# Patient Record
Sex: Female | Born: 1990 | Race: Black or African American | Hispanic: No | Marital: Single | Smoking: Never smoker
Health system: Southern US, Community
[De-identification: ages and names within clinical notes are randomized; demographics above are authoritative.]

## PROBLEM LIST (undated history)

## (undated) ENCOUNTER — Inpatient Hospital Stay (HOSPITAL_COMMUNITY): Payer: Self-pay

## (undated) DIAGNOSIS — F32A Depression, unspecified: Secondary | ICD-10-CM

## (undated) DIAGNOSIS — G510 Bell's palsy: Secondary | ICD-10-CM

## (undated) DIAGNOSIS — Z8619 Personal history of other infectious and parasitic diseases: Secondary | ICD-10-CM

## (undated) DIAGNOSIS — F329 Major depressive disorder, single episode, unspecified: Secondary | ICD-10-CM

## (undated) DIAGNOSIS — D649 Anemia, unspecified: Secondary | ICD-10-CM

## (undated) HISTORY — DX: Personal history of other infectious and parasitic diseases: Z86.19

## (undated) HISTORY — DX: Depression, unspecified: F32.A

## (undated) HISTORY — PX: NO PAST SURGERIES: SHX2092

## (undated) SURGERY — Surgical Case
Anesthesia: *Unknown

---
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
## 2012-10-28 DIAGNOSIS — G510 Bell's palsy: Secondary | ICD-10-CM

## 2012-10-28 HISTORY — DX: Bell's palsy: G51.0

## 2012-10-28 NOTE — L&D Delivery Note (Signed)
Delivery Summary for Suzanne Lamb  Labor Events:   Preterm labor:   Rupture date:   Rupture time:   Rupture type: Spontaneous  Fluid Color: Light Meconium  Induction:   Augmentation:   Complications:   Cervical ripening:          Delivery:   Episiotomy:   Lacerations:   Repair suture:   Repair # of packets:   Blood loss (ml):    Information for the patient's newborn:  [name], West [[id number]]    Delivery 06/16/2013 6:16 PM by  Vaginal, Spontaneous Delivery Sex:  female Gestational Age: <None> Delivery Clinician:  Thresia Ramanathan A Sache Sane Living?: Yes        APGARS  One minute Five minutes Ten minutes  Skin color: 0   1      Heart rate: 2   2      Grimace: 2   2      Muscle tone: 2   2      Breathing: 2   2      Totals: 8  9      Presentation/position: Vertex  Right Occiput Anterior Resuscitation: None  Cord information: 3 vessels   Disposition of cord blood: No    Blood gases sent? No Complications: None  Placenta: Delivered: 06/16/2013 6:21 PM  Spontaneous  Intact appearance Newborn Measurements: Weight:   Height:   Head circumference:   Chest circumference:   Other providers:  Bethann Berkshire  Additional  information: Forceps:   Vacuum:   Breech:   Observed anomalies No anomalies noted       Delivery Note At 6:16 PM a viable female was delivered via Vaginal, Spontaneous Delivery (Presentation: Right Occiput Anterior).  APGAR: 8, 9; weight 7 lb 2.1 oz (3235 g).   Placenta status: Intact, Spontaneous.  Cord: 3 vessels with the following complications: None.  Cord pH: not sent  Anesthesia: None  Episiotomy: None Lacerations: 2nd degree Suture Repair: 2.0 chromic Est. Blood Loss (mL): 400  Mom to postpartum.  Baby to nursery-stable.  [name] A 06/17/2013, 10:52 AM

## 2012-11-25 ENCOUNTER — Encounter (HOSPITAL_COMMUNITY): Payer: Self-pay

## 2012-11-25 ENCOUNTER — Emergency Department (HOSPITAL_COMMUNITY)
Admission: EM | Admit: 2012-11-25 | Discharge: 2012-11-25 | Disposition: A | Discharge status: Home / Self Care | Payer: Medicaid Other | Attending: Emergency Medicine | Admitting: Emergency Medicine

## 2012-11-25 DIAGNOSIS — N949 Unspecified condition associated with female genital organs and menstrual cycle: Secondary | ICD-10-CM

## 2012-11-25 DIAGNOSIS — N760 Acute vaginitis: Secondary | ICD-10-CM

## 2012-11-25 DIAGNOSIS — Z331 Pregnant state, incidental: Secondary | ICD-10-CM | POA: Insufficient documentation

## 2012-11-25 DIAGNOSIS — R110 Nausea: Secondary | ICD-10-CM | POA: Insufficient documentation

## 2012-11-25 DIAGNOSIS — Z3490 Encounter for supervision of normal pregnancy, unspecified, unspecified trimester: Secondary | ICD-10-CM

## 2012-11-25 LAB — URINALYSIS, MICROSCOPIC ONLY
Bilirubin Urine: NEGATIVE
Glucose, UA: 100 mg/dL — AB
Hgb urine dipstick: NEGATIVE
Ketones, ur: NEGATIVE mg/dL
Leukocytes, UA: NEGATIVE
Nitrite: NEGATIVE
Protein, ur: NEGATIVE mg/dL
Specific Gravity, Urine: 1.024 (ref 1.005–1.030)
Urobilinogen, UA: 1 mg/dL (ref 0.0–1.0)
pH: 5.5 (ref 5.0–8.0)

## 2012-11-25 LAB — WET PREP, GENITAL
Trich, Wet Prep: NONE SEEN
Yeast Wet Prep HPF POC: NONE SEEN

## 2012-11-25 LAB — GC/CHLAMYDIA PROBE AMP
CT Probe RNA: NEGATIVE
GC Probe RNA: NEGATIVE

## 2012-11-25 MED ORDER — METRONIDAZOLE 0.75 % VA GEL: 1.0000 | Freq: Every day | VAGINAL | Status: DC

## 2012-11-25 MED ORDER — PRENATAL COMPLETE 14-0.4 MG PO TABS: 1.0000 | ORAL_TABLET | Freq: Every day | ORAL | Status: DC

## 2012-11-25 MED ORDER — METRONIDAZOLE 0.75 % VA GEL
1.0000 | Freq: Once | VAGINAL | Status: AC
  Administered 2012-11-25: 1 via VAGINAL
  Filled 2012-11-25: qty 70

## 2012-11-25 NOTE — ED Notes (Addendum)
Patient presents with lower abd pain x "several weeks". Described as an intermittent "twisting" and "throbbing" pain. Denies N/V, constipation or diarrhea. No fevers or chills. LMP 1st week of Dec 2013. sts that she "probably" could be pregnant.

## 2012-11-25 NOTE — ED Provider Notes (Signed)
History     CSN: [id number]  Arrival date & time 11/25/12  0030   First MD Initiated Contact with Patient 11/25/12 0032      Chief Complaint  Patient presents with  . Abdominal Pain    (Consider location/radiation/quality/duration/timing/severity/associated sxs/prior treatment) HPI 22 year old female presents to emergency room complaining of lower abdomen/pelvic pain over the last month. Patient is unsure of her last mental period, thinks it was end of November or beginning of December. She reports her periods are usually very irregular. She is sexually active, reports she uses condoms every time. She has had some nausea. She denies any vomiting, no fever no diarrhea. She reports she has normal bowel movements. Pain is constant, stretches from her left pelvis up into her left ribs. She has not taken anything for the pain. She has not had a home pregnancy test. No prior pregnancies. She denies any urinary symptoms, no vaginal discharge. History reviewed. No pertinent past medical history.  History reviewed. No pertinent past surgical history.  No family history on file.  History  Substance Use Topics  . Smoking status: Never Smoker   . Smokeless tobacco: Never Used  . Alcohol Use: No    OB History    Grav Para Term Preterm Abortions TAB SAB Ect Mult Living                  Review of Systems  See History of Present Illness; otherwise all other systems are reviewed and negative Allergies  Review of patient's allergies indicates no known allergies.  Home Medications  No current outpatient prescriptions on file.  BP 114/79  Pulse 92  Temp 98.7 F (37.1 C) (Oral)  Resp 16  SpO2 100%  LMP 09/27/2012  Physical Exam  Nursing note and vitals reviewed. Constitutional: She is oriented to person, place, and time. She appears well-developed and well-nourished.  HENT:  Head: Normocephalic and atraumatic.  Nose: Nose normal.  Mouth/Throat: Oropharynx is clear and moist.    Eyes: Conjunctivae normal and EOM are normal. Pupils are equal, round, and reactive to light.  Neck: Normal range of motion. Neck supple. No JVD present. No tracheal deviation present. No thyromegaly present.  Cardiovascular: Normal rate, regular rhythm, normal heart sounds and intact distal pulses.  Exam reveals no gallop and no friction rub.   No murmur heard. Pulmonary/Chest: Effort normal and breath sounds normal. No stridor. No respiratory distress. She has no wheezes. She has no rales. She exhibits no tenderness.  Abdominal: Soft. Bowel sounds are normal. She exhibits mass (gravid uterus felt just out of the pelvis halfway in between the umbilicus and pubic bone). She exhibits no distension. There is tenderness (mild lower abdominal tenderness left greater than right). There is no rebound and no guarding.  Genitourinary: Vagina normal.       Gravid uterus palpated, uterus most likely retroverted. No adnexal tenderness. No vaginal discharge noted.  Musculoskeletal: Normal range of motion. She exhibits no edema and no tenderness.  Lymphadenopathy:    She has no cervical adenopathy.  Neurological: She is alert and oriented to person, place, and time. No cranial nerve deficit. She exhibits normal muscle tone. Coordination normal.  Skin: Skin is warm and dry. No rash noted. No erythema. No pallor.  Psychiatric: She has a normal mood and affect. Her behavior is normal. Judgment and thought content normal.    ED Course  Procedures (including critical care time)  Labs Reviewed  URINALYSIS, MICROSCOPIC ONLY - Abnormal; Notable for the following:  APPearance CLOUDY (*)     Glucose, UA 100 (*)     Squamous Epithelial / LPF FEW (*)     All other components within normal limits  POCT PREGNANCY, URINE - Abnormal; Notable for the following:    Preg Test, Ur POSITIVE (*)     All other components within normal limits  WET PREP, GENITAL - Abnormal; Notable for the following:    Clue Cells Wet  Prep HPF POC MODERATE (*)     WBC, Wet Prep HPF POC FEW (*)     All other components within normal limits  GC/CHLAMYDIA PROBE AMP   No results found.   1. Intrauterine pregnancy   2. Round ligament pain   3. Bacterial vaginosis       MDM  22 year old female with positive pregnancy test. Patient is unsure of LMP. If we oh by December 1 as LMP, gestational age is 8 weeks and 3 days. Bedside ultrasound completed. IUP, with Active fetus noted with heart rates in 170s, suspect patient is further along than her LMP estimate. Suspect anywhere from 12-15 weeks. Will start patient on prenatal vitamins. Pain in left lower quadrant most likely round ligament pain, will have patient take Tylenol as needed. We'll refer her to local OB groups.        Olivia Mackie, MD 11/25/12 [phone]

## 2012-11-25 NOTE — ED Notes (Signed)
Pt discharged.Vital signs stable and GCS 15 

## 2013-02-02 LAB — OB RESULTS CONSOLE HEPATITIS B SURFACE ANTIGEN: Hepatitis B Surface Ag: NEGATIVE

## 2013-02-02 LAB — OB RESULTS CONSOLE HIV ANTIBODY (ROUTINE TESTING): HIV: NONREACTIVE

## 2013-02-02 LAB — OB RESULTS CONSOLE GC/CHLAMYDIA: Chlamydia: NEGATIVE

## 2013-02-02 LAB — OB RESULTS CONSOLE RPR: RPR: NONREACTIVE

## 2013-02-02 LAB — OB RESULTS CONSOLE ABO/RH: RH Type: POSITIVE

## 2013-03-11 ENCOUNTER — Other Ambulatory Visit: Payer: Self-pay

## 2013-03-25 ENCOUNTER — Ambulatory Visit (HOSPITAL_COMMUNITY)
Admission: RE | Admit: 2013-03-25 | Discharge: 2013-03-25 | Disposition: A | Discharge status: Home / Self Care | Payer: Medicaid Other | Source: Ambulatory Visit | Attending: Obstetrics and Gynecology | Admitting: Obstetrics and Gynecology

## 2013-03-25 ENCOUNTER — Other Ambulatory Visit: Payer: Self-pay | Admitting: Obstetrics and Gynecology

## 2013-03-25 ENCOUNTER — Encounter (HOSPITAL_COMMUNITY): Payer: Self-pay

## 2013-03-25 DIAGNOSIS — O26872 Cervical shortening, second trimester: Secondary | ICD-10-CM

## 2013-03-25 DIAGNOSIS — Z3689 Encounter for other specified antenatal screening: Secondary | ICD-10-CM | POA: Insufficient documentation

## 2013-03-25 DIAGNOSIS — O26873 Cervical shortening, third trimester: Secondary | ICD-10-CM

## 2013-03-25 DIAGNOSIS — O26879 Cervical shortening, unspecified trimester: Secondary | ICD-10-CM | POA: Insufficient documentation

## 2013-03-25 MED ORDER — BETAMETHASONE SOD PHOS & ACET 6 (3-3) MG/ML IJ SUSP
12.0000 mg | Freq: Once | INTRAMUSCULAR | Status: AC
  Administered 2013-03-25: 12 mg via INTRAMUSCULAR
  Filled 2013-03-25: qty 2

## 2013-03-25 NOTE — ED Notes (Signed)
Pt received first dose of BMZ IM in right buttocks.  2nd dose to be given in MD office tomorrow per Dr. Stefano Gaul.

## 2013-03-25 NOTE — Progress Notes (Signed)
Maternal Fetal Medicine Consultation  Requesting Provider(s): Osborn Coho, MD  Reason for consultation: shortened cervix  HPI: Suzanne Lamb is a 22 yo G1P0 currently at 43 6/7 weeks who was seen for consultation due to recent diagnosis of shortened cervix on recent clinic ultrasound.  On ultrasound on 5/16, she was noted to have a cervical length of 1.4-1.5 cm with funneling.  She is seen today for evaluation and recommendations for management.  She denies uterine contractions, vaginal bleeding or leakage of fluid.  Her prenatal course has otherwise been uncomplicated.  OB History: OB History   Grav Para Term Preterm Abortions TAB SAB Ect Mult Living   1               PMH: neg  PSH: neg  Meds: PNV  Allergies: NKDA  FH: Denies family history of birth defects or hereditary disorders  Soc: Denies tobacco, ETOH or illicit drug use  Review of Systems: no vaginal bleeding or cramping/contractions, no LOF, no nausea/vomiting. All other systems reviewed and are negative.  PE:  180 lbs, 113/68, 110  GEN: well-appearing female ABD: gravid, NT  Ultrasound: Single IUP at 28 6/7 weeks Limited ultrasound for cervical length only  TVUS - cervical length of 1.1 cm noted.  Dynamic changes associated with U-shaped funneling in response to abdominal pressure  A/P: 1) Single IUP at 28 6/7 weeks         2) Shortened cervical length (1.1 cm) - the findings and limitations of the study were discussed.  She is currently asymptomatic, but clearly at increased risk for preterm delivery.  The benefit of vaginal progesterone at this gestational age is limited, but would recommend a course of betamethasone.  Her first dose was given in the clinic today, and she will follow up with [facility] tomorrow for her second dose.  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  A letter was given to restrict the amount of time that she spends on her feet.  Recommend follow up cervical length in 1  week - if cervical shortening is noted to be worsening, recommend modified bedrest at that time.  Preterm labor precautions were given.   Thank you for the opportunity to be a part of the care of Suzanne Lamb. Please contact our office if we can be of further assistance.   I spent approximately 20 minutes with this patient with over 50% of time spent in face-to-face counseling.  Alpha Gula, MD Maternal Fetal Medicine

## 2013-03-26 ENCOUNTER — Ambulatory Visit (HOSPITAL_COMMUNITY)
Admission: RE | Admit: 2013-03-26 | Discharge: 2013-03-26 | Disposition: A | Discharge status: Home / Self Care | Payer: Medicaid Other | Source: Ambulatory Visit | Attending: Obstetrics and Gynecology | Admitting: Obstetrics and Gynecology

## 2013-03-26 DIAGNOSIS — O4700 False labor before 37 completed weeks of gestation, unspecified trimester: Secondary | ICD-10-CM | POA: Insufficient documentation

## 2013-03-26 DIAGNOSIS — Z3689 Encounter for other specified antenatal screening: Secondary | ICD-10-CM | POA: Insufficient documentation

## 2013-03-26 DIAGNOSIS — O26879 Cervical shortening, unspecified trimester: Secondary | ICD-10-CM | POA: Insufficient documentation

## 2013-03-26 MED ORDER — BETAMETHASONE SOD PHOS & ACET 6 (3-3) MG/ML IJ SUSP
12.0000 mg | Freq: Once | INTRAMUSCULAR | Status: AC
  Administered 2013-03-26: 12 mg via INTRAMUSCULAR
  Filled 2013-03-26: qty 2

## 2013-03-26 NOTE — ED Notes (Signed)
Pt back today stating that Dr. Debria Garret office was unable to administer 2nd dose of celestone.  Medication ordered and will administer.

## 2013-04-01 ENCOUNTER — Ambulatory Visit (HOSPITAL_COMMUNITY)
Admission: RE | Admit: 2013-04-01 | Discharge: 2013-04-01 | Disposition: A | Discharge status: Home / Self Care | Payer: Medicaid Other | Source: Ambulatory Visit | Attending: Obstetrics and Gynecology | Admitting: Obstetrics and Gynecology

## 2013-04-01 DIAGNOSIS — O26873 Cervical shortening, third trimester: Secondary | ICD-10-CM

## 2013-04-01 DIAGNOSIS — O26879 Cervical shortening, unspecified trimester: Secondary | ICD-10-CM | POA: Insufficient documentation

## 2013-04-01 DIAGNOSIS — Z3689 Encounter for other specified antenatal screening: Secondary | ICD-10-CM | POA: Insufficient documentation

## 2013-05-20 LAB — OB RESULTS CONSOLE GC/CHLAMYDIA: Chlamydia: NEGATIVE

## 2013-06-15 ENCOUNTER — Encounter (HOSPITAL_COMMUNITY): Payer: Self-pay | Admitting: *Deleted

## 2013-06-15 ENCOUNTER — Telehealth (HOSPITAL_COMMUNITY): Payer: Self-pay | Admitting: *Deleted

## 2013-06-15 NOTE — Telephone Encounter (Signed)
Preadmission screen  

## 2013-06-16 ENCOUNTER — Inpatient Hospital Stay (HOSPITAL_COMMUNITY)
Admission: AD | Admit: 2013-06-16 | Discharge: 2013-06-18 | DRG: 775 | Disposition: A | Discharge status: Home / Self Care | Payer: Medicaid Other | Source: Ambulatory Visit | Attending: Obstetrics and Gynecology | Admitting: Obstetrics and Gynecology

## 2013-06-16 ENCOUNTER — Encounter (HOSPITAL_COMMUNITY): Payer: Self-pay | Admitting: *Deleted

## 2013-06-16 DIAGNOSIS — D62 Acute posthemorrhagic anemia: Secondary | ICD-10-CM | POA: Diagnosis not present

## 2013-06-16 DIAGNOSIS — D649 Anemia, unspecified: Secondary | ICD-10-CM | POA: Diagnosis not present

## 2013-06-16 DIAGNOSIS — O9903 Anemia complicating the puerperium: Secondary | ICD-10-CM | POA: Diagnosis not present

## 2013-06-16 LAB — AMNIOSTAT, AMNIOTIC FLUID: Gestational Age: 40.5 weeks

## 2013-06-16 LAB — CBC
HCT: 38.5 % (ref 36.0–46.0)
Hemoglobin: 12 g/dL (ref 12.0–15.0)
MCH: 31.8 pg (ref 26.0–34.0)
MCHC: 31.2 g/dL (ref 30.0–36.0)
MCV: 102.1 fL — ABNORMAL HIGH (ref 78.0–100.0)
Platelets: 221 K/uL (ref 150–400)
RBC: 3.77 MIL/uL — ABNORMAL LOW (ref 3.87–5.11)
RDW: 15.9 % — ABNORMAL HIGH (ref 11.5–15.5)
WBC: 20.8 K/uL — ABNORMAL HIGH (ref 4.0–10.5)

## 2013-06-16 MED ORDER — IBUPROFEN 600 MG PO TABS: 600.0000 mg | ORAL_TABLET | Freq: Four times a day (QID) | ORAL | Status: DC | PRN

## 2013-06-16 MED ORDER — TETANUS-DIPHTH-ACELL PERTUSSIS 5-2.5-18.5 LF-MCG/0.5 IM SUSP: 0.5000 mL | Freq: Once | INTRAMUSCULAR | Status: DC

## 2013-06-16 MED ORDER — WITCH HAZEL-GLYCERIN EX PADS: 1.0000 "application " | MEDICATED_PAD | CUTANEOUS | Status: DC | PRN

## 2013-06-16 MED ORDER — IBUPROFEN 600 MG PO TABS
600.0000 mg | ORAL_TABLET | Freq: Four times a day (QID) | ORAL | Status: DC
  Administered 2013-06-16 – 2013-06-18 (×5): 600 mg via ORAL
  Filled 2013-06-16 (×6): qty 1

## 2013-06-16 MED ORDER — PRENATAL MULTIVITAMIN CH
1.0000 | ORAL_TABLET | Freq: Every day | ORAL | Status: DC
  Administered 2013-06-17: 1 via ORAL
  Filled 2013-06-16: qty 1

## 2013-06-16 MED ORDER — LIDOCAINE HCL (PF) 1 % IJ SOLN
INTRAMUSCULAR | Status: AC
  Administered 2013-06-16: 30 mL
  Filled 2013-06-16: qty 30

## 2013-06-16 MED ORDER — ONDANSETRON HCL 4 MG/2ML IJ SOLN: 4.0000 mg | INTRAMUSCULAR | Status: DC | PRN

## 2013-06-16 MED ORDER — MEASLES, MUMPS & RUBELLA VAC ~~LOC~~ INJ: 0.5000 mL | INJECTION | Freq: Once | SUBCUTANEOUS | Status: DC

## 2013-06-16 MED ORDER — DIPHENHYDRAMINE HCL 25 MG PO CAPS: 25.0000 mg | ORAL_CAPSULE | Freq: Four times a day (QID) | ORAL | Status: DC | PRN

## 2013-06-16 MED ORDER — LACTATED RINGERS IV SOLN: INTRAVENOUS | Status: DC

## 2013-06-16 MED ORDER — LANOLIN HYDROUS EX OINT: TOPICAL_OINTMENT | CUTANEOUS | Status: DC | PRN

## 2013-06-16 MED ORDER — ONDANSETRON HCL 4 MG PO TABS: 4.0000 mg | ORAL_TABLET | ORAL | Status: DC | PRN

## 2013-06-16 MED ORDER — OXYTOCIN 40 UNITS IN LACTATED RINGERS INFUSION - SIMPLE MED
INTRAVENOUS | Status: AC
  Filled 2013-06-16: qty 1000

## 2013-06-16 MED ORDER — SIMETHICONE 80 MG PO CHEW: 80.0000 mg | CHEWABLE_TABLET | ORAL | Status: DC | PRN

## 2013-06-16 MED ORDER — ONDANSETRON HCL 4 MG/2ML IJ SOLN: 4.0000 mg | Freq: Four times a day (QID) | INTRAMUSCULAR | Status: DC | PRN

## 2013-06-16 MED ORDER — ACETAMINOPHEN 325 MG PO TABS: 650.0000 mg | ORAL_TABLET | ORAL | Status: DC | PRN

## 2013-06-16 MED ORDER — BENZOCAINE-MENTHOL 20-0.5 % EX AERO
1.0000 "application " | INHALATION_SPRAY | CUTANEOUS | Status: DC | PRN
  Administered 2013-06-16: 1 via TOPICAL
  Filled 2013-06-16: qty 56

## 2013-06-16 MED ORDER — OXYCODONE-ACETAMINOPHEN 5-325 MG PO TABS: 1.0000 | ORAL_TABLET | ORAL | Status: DC | PRN

## 2013-06-16 MED ORDER — ZOLPIDEM TARTRATE 5 MG PO TABS: 5.0000 mg | ORAL_TABLET | Freq: Every evening | ORAL | Status: DC | PRN

## 2013-06-16 MED ORDER — SENNOSIDES-DOCUSATE SODIUM 8.6-50 MG PO TABS
2.0000 | ORAL_TABLET | Freq: Every day | ORAL | Status: DC
  Administered 2013-06-16: 2 via ORAL

## 2013-06-16 MED ORDER — DIBUCAINE 1 % RE OINT: 1.0000 "application " | TOPICAL_OINTMENT | RECTAL | Status: DC | PRN

## 2013-06-16 MED ORDER — LIDOCAINE HCL (PF) 1 % IJ SOLN
30.0000 mL | INTRAMUSCULAR | Status: DC | PRN
  Filled 2013-06-16: qty 30

## 2013-06-16 MED ORDER — LACTATED RINGERS IV SOLN: 500.0000 mL | INTRAVENOUS | Status: DC | PRN

## 2013-06-16 MED ORDER — OXYTOCIN BOLUS FROM INFUSION: 500.0000 mL | INTRAVENOUS | Status: DC

## 2013-06-16 MED ORDER — OXYCODONE-ACETAMINOPHEN 5-325 MG PO TABS
1.0000 | ORAL_TABLET | ORAL | Status: DC | PRN
  Administered 2013-06-16 – 2013-06-18 (×3): 1 via ORAL
  Filled 2013-06-16 (×3): qty 1

## 2013-06-16 MED ORDER — CITRIC ACID-SODIUM CITRATE 334-500 MG/5ML PO SOLN: 30.0000 mL | ORAL | Status: DC | PRN

## 2013-06-16 MED ORDER — OXYTOCIN 40 UNITS IN LACTATED RINGERS INFUSION - SIMPLE MED: 62.5000 mL/h | INTRAVENOUS | Status: DC

## 2013-06-16 NOTE — Progress Notes (Signed)
Pt and baby transferred to [facility] without difficulty.

## 2013-06-16 NOTE — MAU Note (Signed)
At office earlier today and sent home (4cm), at home SROM, clear/yellowish, @ 1330

## 2013-06-16 NOTE — H&P (Signed)
Suzanne Lamb is a 22 y.o. female presenting for labor.  . Maternal Medical History:  Reason for admission: Contractions.   Contractions: Onset was yesterday.   Frequency: regular.   Perceived severity is strong.    Fetal activity: Perceived fetal activity is normal.   Last perceived fetal movement was within the past hour.      OB History   Grav Para Term Preterm Abortions TAB SAB Ect Mult Living   1 1 1       1      Past Medical History  Diagnosis Date  . Hx of gonorrhea   . Hx of chlamydia infection    Past Surgical History  Procedure Laterality Date  . No past surgeries     Family History: family history includes Diabetes in her mother; Hypertension in her mother; Kidney disease in her mother. There is no history of Alcohol abuse, Arthritis, Asthma, Birth defects, Cancer, COPD, Depression, Drug abuse, Early death, Hearing loss, Heart disease, Hyperlipidemia, Learning disabilities, Mental illness, Mental retardation, Miscarriages / Stillbirths, Stroke, or Vision loss. Social History:  reports that she has never smoked. She has never used smokeless tobacco. She reports that she uses illicit drugs (Marijuana). She reports that she does not drink alcohol.   Prenatal Transfer Tool  Maternal Diabetes: No Genetic Screening: Declined Maternal Ultrasounds/Referrals: Abnormal:  Findings:   Other: Fetal Ultrasounds or other Referrals:  Other:  short cervix.  Pt s/p BMZ in May Maternal Substance Abuse:  Yes:  Type: Marijuana Significant Maternal Medications:  None Significant Maternal Lab Results:  None Other Comments:  None  ROS BP 113/76  Pulse 106  Temp(Src) 98.3 F (36.8 C) (Oral)  Resp 20  SpO2 97%  LMP 09/04/2012 Physical Examination: General appearance - alert, well appearing, and in no distress and uncooperative Chest - clear to auscultation, no wheezes, rales or rhonchi, symmetric air entry, rhonchi noted  Heart - normal rate and regular rhythm Abdomen - soft,  nontender, nondistended, no masses or organomegaly Extremities - Homan's sign negative bilaterally Dilation: 10 Effacement (%): 100 Station: +2 Exam by:: Montez Morita, RNC Blood pressure 113/76, pulse 106, temperature 98.3 F (36.8 C), temperature source Oral, resp. rate 20, last menstrual period 09/04/2012, SpO2 97.00%, unknown if currently breastfeeding. Exam Physical Exam  Prenatal labs: ABO, Rh: O/Positive/-- (04/08 0000) Antibody: Negative (04/08 0000) Rubella: Immune (04/08 0000) RPR: Nonreactive (04/08 0000)  HBsAg: Negative (04/08 0000)  HIV: Non-reactive (04/08 0000)  GBS: Negative (07/24 0000)   Assessment/Plan: Admit to L&DEpidural prn Antcipate SVD   Bernerd Terhune A 06/16/2013, 11:54 PM

## 2013-06-16 NOTE — Progress Notes (Signed)
Patient up walking around the room, unable to sit upon arrival to room 4-5 min ago

## 2013-06-17 LAB — CBC
HCT: 26.1 % — ABNORMAL LOW (ref 36.0–46.0)
Hemoglobin: 9.6 g/dL — ABNORMAL LOW (ref 12.0–15.0)
MCV: 87.3 fL (ref 78.0–100.0)
RBC: 2.99 MIL/uL — ABNORMAL LOW (ref 3.87–5.11)
RDW: 14 % (ref 11.5–15.5)
WBC: 21.8 10*3/uL — ABNORMAL HIGH (ref 4.0–10.5)

## 2013-06-17 LAB — RPR: RPR Ser Ql: NONREACTIVE

## 2013-06-17 NOTE — Clinical Social Work Maternal (Signed)
    Clinical Social Work Department PSYCHOSOCIAL ASSESSMENT - MATERNAL/CHILD 06/17/2013  Patient:  ABY, GESSEL  Account Number:  [id number]  Admit Date:  06/16/2013  Marjo Bicker Name:   Rosita Kea    Clinical Social Worker:  Nobie Putnam, LCSW   Date/Time:  06/17/2013 03:33 PM  Date Referred:  06/17/2013   Referral source  CN     Referred reason  Substance Abuse   Other referral source:    I:  FAMILY / HOME ENVIRONMENT Child's legal guardian:  PARENT  Guardian - Name Guardian - Age Guardian - Address  Nelson Julson [address] [place], [place] [address]  Harland Dingwall 24    Other household support members/support persons Name Relationship DOB  Candelaria Stagers MOTHER    BROTHER    Other support:    II  PSYCHOSOCIAL DATA Information Source:  Patient Interview  Event organiser Employment:   Financial resources:  Medicaid If Medicaid - County:  [place] Other  WIC   School / Grade:   Maternity Care Coordinator / Child Services Coordination / Early Interventions:  Cultural issues impacting care:    III  STRENGTHS Strengths  Adequate Resources  Home prepared for Child (including basic supplies)  Supportive family/friends   Strength comment:    IV  RISK FACTORS AND CURRENT PROBLEMS Current Problem:  YES   Risk Factor & Current Problem Patient Issue Family Issue Risk Factor / Current Problem Comment  Substance Abuse Y N Hx of MJ use    V  SOCIAL WORK ASSESSMENT CSW referral received to assess pt's history of MJ use.  Pt admits to smoking MJ "once or twice a week," prior to pregnancy confirmation @ 6 weeks.  Once pregnancy was confirmed, she stopped smoking immediately.  She denies other illegal substance use & verbalized understanding of hospital drug testing policy.  UDS & meconium results are pending.  Pt has all the necessary supplies for the infant & good family support.  FOB was at the bedside & appears supportive.  CSW will  monitor drug screen results & make a referral if needed.      VI SOCIAL WORK PLAN Social Work Plan  No Further Intervention Required / No Barriers to Discharge   Type of pt/family education:   If child protective services report - county:   If child protective services report - date:   Information/referral to community resources comment:   Other social work plan:

## 2013-06-18 ENCOUNTER — Inpatient Hospital Stay (HOSPITAL_COMMUNITY): Admission: RE | Admit: 2013-06-18 | Payer: Medicaid Other | Source: Ambulatory Visit

## 2013-06-18 DIAGNOSIS — D62 Acute posthemorrhagic anemia: Secondary | ICD-10-CM | POA: Diagnosis not present

## 2013-06-18 DIAGNOSIS — O26879 Cervical shortening, unspecified trimester: Secondary | ICD-10-CM | POA: Insufficient documentation

## 2013-06-18 MED ORDER — MEDROXYPROGESTERONE ACETATE 150 MG/ML IM SUSP: 150.0000 mg | Freq: Once | INTRAMUSCULAR | Status: DC

## 2013-06-18 MED ORDER — FUSION PLUS PO CAPS: 1.0000 | ORAL_CAPSULE | Freq: Two times a day (BID) | ORAL | Status: DC

## 2013-06-18 MED ORDER — OXYCODONE-ACETAMINOPHEN 5-325 MG PO TABS: 1.0000 | ORAL_TABLET | ORAL | Status: DC | PRN

## 2013-06-18 MED ORDER — IBUPROFEN 600 MG PO TABS: 600.0000 mg | ORAL_TABLET | Freq: Four times a day (QID) | ORAL | Status: DC

## 2013-06-18 MED ORDER — MEDROXYPROGESTERONE ACETATE 150 MG/ML IM SUSP: 150.0000 mg | INTRAMUSCULAR | Status: DC

## 2013-06-18 NOTE — Discharge Summary (Signed)
   Obstetric Discharge Summary Reason for Admission: onset of labor Prenatal Procedures: none Intrapartum Procedures: spontaneous vaginal delivery Postpartum Procedures: none Complications-Operative and Postpartum: 2nd degree perineal laceration  Temp:  [97.5 F (36.4 C)-98.3 F (36.8 C)] 97.5 F (36.4 C) (08/22 0506) Pulse Rate:  [74-91] 74 (08/22 0506) Resp:  [16-18] 16 (08/22 0506) BP: (88-108)/(46-72) 88/46 mmHg (08/22 0506) SpO2:  [100 %] 100 % (08/22 0506) Weight:  [194 lb 8 oz (88.225 kg)] 194 lb 8 oz (88.225 kg) (08/22 0506)  Post Partum Day 2 Subjective: no complaints, up ad lib, voiding and tolerating PO  Objective: Blood pressure 88/46, pulse 74, temperature 97.5 F (36.4 C), temperature source Oral, resp. rate 16, height 5\' 9"  (1.753 m), weight 194 lb 8 oz (88.225 kg), last menstrual period 09/04/2012, SpO2 100.00%, unknown if currently breastfeeding.  Physical Exam:  General: alert, cooperative and no distress Lochia: appropriate Uterine Fundus: firm DVT Evaluation: No evidence of DVT seen on physical exam.   Hemoglobin  Date Value Range Status  06/17/2013 9.6* 12.0 - 15.0 g/dL Final     DELTA CHECK NOTED     REPEATED TO VERIFY     HCT  Date Value Range Status  06/17/2013 26.1* 36.0 - 46.0 % Final    Hospital Course:  Hospital Course: Admitted in labor and completely dilated. Neg GBS.Delivery was performed by Dr. Normand Sloop without difficulty. Patient and baby tolerated the procedure without difficulty, with a 2nd laceration noted. Infant to FTN. Mother and infant then had an uncomplicated postpartum course, with breast feeding going well. Mom's physical exam was WNL, and she was discharged home in stable condition. Contraception plan was Depo Provera.  She received adequate benefit from po pain medications.  Discharge Diagnoses: Term Pregnancy-delivered and anemia  Discharge Information: Date: 06/18/2013 Activity: pelvic rest Diet: routine and High iron  foods Medications:    Medication List         FUSION PLUS Caps  Take 1 tablet by mouth 2 (two) times daily.     ibuprofen 600 MG tablet  Commonly known as:  ADVIL,MOTRIN  Take 1 tablet (600 mg total) by mouth every 6 (six) hours.     medroxyPROGESTERone 150 MG/ML injection  Commonly known as:  DEPO-PROVERA  Inject 1 mL (150 mg total) into the muscle every 3 (three) months.     oxyCODONE-acetaminophen 5-325 MG per tablet  Commonly known as:  PERCOCET/ROXICET  Take 1-2 tablets by mouth every 4 (four) hours as needed.     PRENATAL COMPLETE 14-0.4 MG Tabs  Take 1 tablet by mouth daily.       Condition: improved Instructions: refer to practice specific booklet and AVS Discharge to: home Follow-up Information   Follow up with [facility] In 6 weeks.   Specialty:  Obstetrics and Gynecology   Contact information:   [address] [place] [address] [phone]      Newborn Data: Live born  Information for the patient's newborn:  Mieka, Leaton [[id number]]  female  ; APGAR 8 ,9 ; weight ; 7lb 2oz Home with mother.  HAYGOOD,VANESSA P 06/18/2013, 10:02 AM

## 2013-07-09 ENCOUNTER — Encounter (HOSPITAL_COMMUNITY): Payer: Self-pay | Admitting: Emergency Medicine

## 2013-07-09 ENCOUNTER — Emergency Department (HOSPITAL_COMMUNITY)
Admission: EM | Admit: 2013-07-09 | Discharge: 2013-07-10 | Disposition: A | Discharge status: Home / Self Care | Payer: Medicaid Other | Attending: Emergency Medicine | Admitting: Emergency Medicine

## 2013-07-09 DIAGNOSIS — G510 Bell's palsy: Secondary | ICD-10-CM | POA: Insufficient documentation

## 2013-07-09 DIAGNOSIS — Z8619 Personal history of other infectious and parasitic diseases: Secondary | ICD-10-CM | POA: Insufficient documentation

## 2013-07-09 DIAGNOSIS — Z79899 Other long term (current) drug therapy: Secondary | ICD-10-CM | POA: Insufficient documentation

## 2013-07-09 LAB — CBC WITH DIFFERENTIAL/PLATELET
Basophils Relative: 0 % (ref 0–1)
HCT: 34.3 % — ABNORMAL LOW (ref 36.0–46.0)
Hemoglobin: 12.4 g/dL (ref 12.0–15.0)
Lymphs Abs: 2.9 10*3/uL (ref 0.7–4.0)
MCHC: 36.2 g/dL — ABNORMAL HIGH (ref 30.0–36.0)
Monocytes Absolute: 0.8 10*3/uL (ref 0.1–1.0)
Monocytes Relative: 8 % (ref 3–12)
Neutro Abs: 6.7 10*3/uL (ref 1.7–7.7)
RBC: 3.99 MIL/uL (ref 3.87–5.11)

## 2013-07-09 LAB — COMPREHENSIVE METABOLIC PANEL
Albumin: 3.6 g/dL (ref 3.5–5.2)
Alkaline Phosphatase: 94 U/L (ref 39–117)
BUN: 5 mg/dL — ABNORMAL LOW (ref 6–23)
CO2: 28 mEq/L (ref 19–32)
Chloride: 104 mEq/L (ref 96–112)
Creatinine, Ser: 0.66 mg/dL (ref 0.50–1.10)
GFR calc Af Amer: >90 mL/min (ref >90)
GFR calc non Af Amer: >90 mL/min (ref >90)
Glucose, Bld: 88 mg/dL (ref 70–99)
Potassium: 3.5 mEq/L (ref 3.5–5.1)
Total Bilirubin: 0.8 mg/dL (ref 0.3–1.2)

## 2013-07-09 NOTE — ED Notes (Addendum)
Patient with left tongue and left facial numbness that started 4 days ago.  Patient does have some facial palsy on left side, no slurred speech.  Patient with equal hand grips and pedal pushes.  Patient is CAOx3.  Patient denies any pain in face.  Patient denies any gait problems.

## 2013-07-10 MED ORDER — PREDNISONE 20 MG PO TABS: 60.0000 mg | ORAL_TABLET | Freq: Every day | ORAL | Status: DC

## 2013-07-10 MED ORDER — PREDNISONE 20 MG PO TABS
60.0000 mg | ORAL_TABLET | Freq: Once | ORAL | Status: AC
  Administered 2013-07-10: 60 mg via ORAL
  Filled 2013-07-10: qty 3

## 2013-07-10 MED ORDER — ARTIFICIAL TEARS OP OINT: TOPICAL_OINTMENT | Freq: Every evening | OPHTHALMIC | Status: DC | PRN

## 2013-07-10 MED ORDER — VALACYCLOVIR HCL 500 MG PO TABS
1000.0000 mg | ORAL_TABLET | Freq: Once | ORAL | Status: AC
  Administered 2013-07-10: 1000 mg via ORAL
  Filled 2013-07-10: qty 2

## 2013-07-10 MED ORDER — VALACYCLOVIR HCL 1 G PO TABS: 1000.0000 mg | ORAL_TABLET | Freq: Three times a day (TID) | ORAL | Status: DC

## 2013-07-10 NOTE — ED Provider Notes (Signed)
Medical screening examination/treatment/procedure(s) were conducted as a shared visit with non-physician practitioner(s) and myself.  I personally evaluated the patient during the encounter  H and P c/w Bell's palsy on Left - left facial weakness/paralysis complete; (cannot raise eyebrow).  Normal neuro examination otherwise. 3-4 days of symptoms. Will treat with valcyclovir and prednisone.  ER precautions given.  PCM follow up and ophtho follow up as needed.  Pt agrees with plan.    Darlys Gales, MD 07/10/13 1001

## 2013-07-10 NOTE — ED Provider Notes (Signed)
CSN: [id number]     Arrival date & time 07/09/13  1935 History   First MD Initiated Contact with Patient 07/10/13 0009     Chief Complaint  Patient presents with  . Numbness   (Consider location/radiation/quality/duration/timing/severity/associated sxs/prior Treatment) HPI Comments: Patient is a 22 year old female who presents today with 4 days of left-sided facial numbness. It began suddenly. Nothing makes the numbness better. Nothing makes and numbness worse. She has trouble eating and drinking due to the left-sided numbness. This has never happened to her in the past. She has had no recent illness. She is 3 weeks post partum. She denies any medical issues. She takes no medications daily.  The history is provided by the patient. No language interpreter was used.    Past Medical History  Diagnosis Date  . Hx of gonorrhea   . Hx of chlamydia infection    Past Surgical History  Procedure Laterality Date  . No past surgeries     Family History  Problem Relation Age of Onset  . Diabetes Mother   . Hypertension Mother   . Kidney disease Mother   . Alcohol abuse Neg Hx   . Arthritis Neg Hx   . Asthma Neg Hx   . Birth defects Neg Hx   . Cancer Neg Hx   . COPD Neg Hx   . Depression Neg Hx   . Drug abuse Neg Hx   . Early death Neg Hx   . Hearing loss Neg Hx   . Heart disease Neg Hx   . Hyperlipidemia Neg Hx   . Learning disabilities Neg Hx   . Mental illness Neg Hx   . Mental retardation Neg Hx   . Miscarriages / Stillbirths Neg Hx   . Stroke Neg Hx   . Vision loss Neg Hx    History  Substance Use Topics  . Smoking status: Never Smoker   . Smokeless tobacco: Never Used  . Alcohol Use: Yes   OB History   Grav Para Term Preterm Abortions TAB SAB Ect Mult Living   1 1 1       1      Review of Systems  Constitutional: Negative for fever and chills.  Gastrointestinal: Negative for nausea, vomiting and abdominal pain.  Skin: Negative for rash.  Neurological: Positive for  numbness. Negative for headaches.  All other systems reviewed and are negative.    Allergies  Review of patient's allergies indicates no known allergies.  Home Medications   Current Outpatient Rx  Name  Route  Sig  Dispense  Refill  . ibuprofen (ADVIL,MOTRIN) 600 MG tablet   Oral   Take 1 tablet (600 mg total) by mouth every 6 (six) hours.   60 tablet   0   . oxyCODONE-acetaminophen (PERCOCET/ROXICET) 5-325 MG per tablet   Oral   Take 1-2 tablets by mouth every 4 (four) hours as needed for pain. For pain         . Prenatal Vit-Fe Fumarate-FA (PRENATAL COMPLETE) 14-0.4 MG TABS   Oral   Take 1 tablet by mouth daily.   60 each   0    BP 146/95  Pulse 70  Temp(Src) 97.9 F (36.6 C) (Oral)  Resp 12  Wt 170 lb 3.2 oz (77.202 kg)  BMI 25.12 kg/m2  SpO2 98% Physical Exam  Nursing note and vitals reviewed. Constitutional: She is oriented to person, place, and time. She appears well-developed and well-nourished. No distress.  HENT:  Head: Normocephalic and atraumatic.  Right Ear: External ear normal.  Left Ear: External ear normal.  Nose: Nose normal.  Mouth/Throat: Oropharynx is clear and moist.  Eyes: Conjunctivae and EOM are normal. Pupils are equal, round, and reactive to light.  Unable to close left eyelid  Neck: Normal range of motion.  Cardiovascular: Normal rate, regular rhythm and normal heart sounds.   Pulmonary/Chest: Effort normal and breath sounds normal. No stridor. No respiratory distress. She has no wheezes. She has no rales.  Abdominal: Soft. She exhibits no distension.  Musculoskeletal: Normal range of motion.  Neurological: She is alert and oriented to person, place, and time. She has normal strength and normal reflexes. A sensory deficit is present. Coordination normal.  Reflex Scores:      Brachioradialis reflexes are 2+ on the right side and 2+ on the left side.      Patellar reflexes are 2+ on the right side and 2+ on the left side. Unable to  lift left side of forehead. Sensory deficit on left side of face.  Grip strength normal bilaterally. DTRs equal and symmetric.  Skin: Skin is warm and dry. She is not diaphoretic. No erythema.  Psychiatric: She has a normal mood and affect. Her behavior is normal.    ED Course  Procedures (including critical care time) Labs Review Labs Reviewed  CBC WITH DIFFERENTIAL - Abnormal; Notable for the following:    WBC 10.6 (*)    HCT 34.3 (*)    MCHC 36.2 (*)    Platelets 433 (*)    All other components within normal limits  COMPREHENSIVE METABOLIC PANEL - Abnormal; Notable for the following:    BUN 5 (*)    All other components within normal limits   Imaging Review No results found.  MDM   1. Bell's palsy    Patient presents with Bell's palsy for the past 4 days. She cannot close her left lid. She was given prednisone and Valtrex in the emergency department to get her first dose tonight. She was given an Rx for prednisone and Valtrex to take for the next week. Lacrilube before bed and PRN through the day. I have discussed with patient that it is possible her symptoms will never resolved and she may have residual damage. Dr. Redgie Grayer evaluated patient and agrees with plan. Return instructions given. Vital signs stable for discharge. Patient / Family / Caregiver informed of clinical course, understand medical decision-making process, and agree with plan.     Mora Bellman, PA-C 07/10/13 [phone]

## 2014-08-29 ENCOUNTER — Encounter (HOSPITAL_COMMUNITY): Payer: Self-pay | Admitting: Emergency Medicine

## 2014-10-24 ENCOUNTER — Encounter (HOSPITAL_COMMUNITY): Payer: Self-pay | Admitting: Family Medicine

## 2014-10-24 ENCOUNTER — Emergency Department (HOSPITAL_COMMUNITY)
Admission: EM | Admit: 2014-10-24 | Discharge: 2014-10-24 | Discharge status: Against Medical Advice | Payer: Medicaid Other | Attending: Emergency Medicine | Admitting: Emergency Medicine

## 2014-10-24 DIAGNOSIS — N938 Other specified abnormal uterine and vaginal bleeding: Secondary | ICD-10-CM | POA: Diagnosis present

## 2014-10-24 LAB — I-STAT BETA HCG BLOOD, ED (MC, WL, AP ONLY): I-stat hCG, quantitative: 1448.4 m[IU]/mL — ABNORMAL HIGH (ref <5)

## 2014-10-24 NOTE — ED Notes (Signed)
Per pt sts severe bleeding and cramping that started last night. sts she is possibly 10 weeks. sts LMP in October. sts she hasn't seen a doctor yet.

## 2014-10-24 NOTE — ED Notes (Signed)
Pt in restroom at this time.

## 2015-02-05 ENCOUNTER — Other Ambulatory Visit (HOSPITAL_COMMUNITY)
Admission: RE | Admit: 2015-02-05 | Discharge: 2015-02-05 | Disposition: A | Discharge status: Home / Self Care | Payer: Self-pay | Source: Ambulatory Visit | Attending: Family Medicine | Admitting: Family Medicine

## 2015-02-05 ENCOUNTER — Emergency Department (INDEPENDENT_AMBULATORY_CARE_PROVIDER_SITE_OTHER)
Admission: EM | Admit: 2015-02-05 | Discharge: 2015-02-05 | Disposition: A | Discharge status: Home / Self Care | Payer: Self-pay | Source: Home / Self Care | Attending: Family Medicine | Admitting: Family Medicine

## 2015-02-05 ENCOUNTER — Encounter (HOSPITAL_COMMUNITY): Payer: Self-pay | Admitting: *Deleted

## 2015-02-05 DIAGNOSIS — N760 Acute vaginitis: Secondary | ICD-10-CM | POA: Insufficient documentation

## 2015-02-05 DIAGNOSIS — N898 Other specified noninflammatory disorders of vagina: Secondary | ICD-10-CM

## 2015-02-05 DIAGNOSIS — Z113 Encounter for screening for infections with a predominantly sexual mode of transmission: Secondary | ICD-10-CM | POA: Insufficient documentation

## 2015-02-05 DIAGNOSIS — N342 Other urethritis: Secondary | ICD-10-CM

## 2015-02-05 HISTORY — DX: Bell's palsy: G51.0

## 2015-02-05 LAB — POCT URINALYSIS DIP (DEVICE)
BILIRUBIN URINE: NEGATIVE
GLUCOSE, UA: NEGATIVE mg/dL
HGB URINE DIPSTICK: NEGATIVE
Ketones, ur: NEGATIVE mg/dL
Leukocytes, UA: NEGATIVE
Nitrite: NEGATIVE
Protein, ur: NEGATIVE mg/dL
SPECIFIC GRAVITY, URINE: 1.025 (ref 1.005–1.030)
Urobilinogen, UA: 1 mg/dL (ref 0.0–1.0)
pH: 6 (ref 5.0–8.0)

## 2015-02-05 LAB — POCT PREGNANCY, URINE: Preg Test, Ur: NEGATIVE

## 2015-02-05 MED ORDER — FLUCONAZOLE 150 MG PO TABS: 150.0000 mg | ORAL_TABLET | Freq: Every day | ORAL | Status: DC

## 2015-02-05 MED ORDER — PHENAZOPYRIDINE HCL 100 MG PO TABS: 100.0000 mg | ORAL_TABLET | Freq: Three times a day (TID) | ORAL | Status: DC | PRN

## 2015-02-05 MED ORDER — METRONIDAZOLE 500 MG PO TABS: 500.0000 mg | ORAL_TABLET | Freq: Two times a day (BID) | ORAL | Status: DC

## 2015-02-05 NOTE — ED Notes (Signed)
C/o vaginal discharge onset 4/2.  States she was using a lubricant that caused rash at first, then itching, irritation and discharge.

## 2015-02-05 NOTE — Discharge Instructions (Signed)
Your symptoms are likely from a bacterial infection in the vagina called bacterial vaginosis. This is from overgrowth of normal bacterial flora. Please start the antibiotic as prescribed. Please start the yeast medicine if you develop a yeast infection. Please use the Pyridium for discomfort during urination. We will call you if the results come back positive.

## 2015-02-05 NOTE — ED Provider Notes (Signed)
CSN: [id number]     Arrival date & time 02/05/15  1600 History   First MD Initiated Contact with Patient 02/05/15 1625     Chief Complaint  Patient presents with  . Vaginal Discharge   (Consider location/radiation/quality/duration/timing/severity/associated sxs/prior Treatment) HPI  Vaginal discharge. Started around 01/27/15. Sexually active w/ condoms but trying different lubes. monostat w/o improvement. WHite discharge. Foul odor started 2 days ago. Associated w/ dysuria w/o frequency. Denies fevers, abdominal pain, frequency, back pain, rash, chest pain, shortness of breath, palpitations, headache, nausea, vomiting, diarrhea, constipation.   Past Medical History  Diagnosis Date  . Hx of gonorrhea   . Hx of chlamydia infection   . Bell's palsy 2014   Past Surgical History  Procedure Laterality Date  . No past surgeries     Family History  Problem Relation Age of Onset  . Diabetes Mother   . Hypertension Mother   . Kidney disease Mother   . Alcohol abuse Neg Hx   . Arthritis Neg Hx   . Asthma Neg Hx   . Birth defects Neg Hx   . Cancer Neg Hx   . COPD Neg Hx   . Depression Neg Hx   . Drug abuse Neg Hx   . Early death Neg Hx   . Hearing loss Neg Hx   . Heart disease Neg Hx   . Hyperlipidemia Neg Hx   . Learning disabilities Neg Hx   . Mental illness Neg Hx   . Mental retardation Neg Hx   . Miscarriages / Stillbirths Neg Hx   . Stroke Neg Hx   . Vision loss Neg Hx    History  Substance Use Topics  . Smoking status: Never Smoker   . Smokeless tobacco: Never Used  . Alcohol Use: No   OB History    Gravida Para Term Preterm AB TAB SAB Ectopic Multiple Living   1 1 1       1      Review of Systems Per HPI with all other pertinent systems negative.   Allergies  Review of patient's allergies indicates no known allergies.  Home Medications   Prior to Admission medications   Not on File   LMP 01/19/2015  Breastfeeding? No Physical Exam Physical Exam   Constitutional: oriented to person, place, and time. appears well-developed and well-nourished. No distress.  HENT:  Head: Normocephalic and atraumatic.  Eyes: EOMI. PERRL.  Neck: Normal range of motion.  Cardiovascular: RRR, no m/r/g, 2+ distal pulses,  Pulmonary/Chest: Effort normal and breath sounds normal. No respiratory distress.  Abdominal: Soft. Bowel sounds are normal. NonTTP, no distension.  Musculoskeletal: Normal range of motion. Non ttp, no effusion.  Neurological: alert and oriented to person, place, and time.  Skin: Skin is warm. No rash noted. non diaphoretic.  Psychiatric: normal mood and affect. behavior is normal. Judgment and thought content normal.  GU: White to greenish vaginal discharge, cervix normal appearing without motion tenderness.  ED Course  Procedures (including critical care time) Labs Review Labs Reviewed  POCT URINALYSIS DIP (DEVICE)  CERVICOVAGINAL ANCILLARY ONLY    Imaging Review No results found.   MDM   1. Urethritis   2. Vaginal discharge   3. Vaginitis    UA without evidence of UTI. Pregnancy negative. Start Pyridium. Suspect that she'll vaginosis. Start metronidazole. STD panel sent and will follow-up as needed.   Ozella Rocksavid J Merrell, MD 02/05/15 [phone]

## 2015-02-06 LAB — CERVICOVAGINAL ANCILLARY ONLY
Chlamydia: NEGATIVE
Neisseria Gonorrhea: NEGATIVE
WET PREP (BD AFFIRM): POSITIVE — AB

## 2015-02-08 NOTE — ED Notes (Signed)
GC/Chlamydia neg., Gardnerella pos.  Pt. adequately treated with Flagyl.  Suzanne Lamb 02/08/2015  

## 2015-06-02 LAB — OB RESULTS CONSOLE GC/CHLAMYDIA
CHLAMYDIA, DNA PROBE: NEGATIVE
Gonorrhea: NEGATIVE

## 2015-06-02 LAB — OB RESULTS CONSOLE RPR: RPR: NONREACTIVE

## 2015-08-23 LAB — OB RESULTS CONSOLE HIV ANTIBODY (ROUTINE TESTING): HIV: NONREACTIVE

## 2015-09-18 ENCOUNTER — Inpatient Hospital Stay (HOSPITAL_COMMUNITY)
Admission: AD | Admit: 2015-09-18 | Discharge: 2015-09-18 | Disposition: A | Discharge status: Home / Self Care | Payer: Medicaid Other | Source: Ambulatory Visit | Attending: Obstetrics and Gynecology | Admitting: Obstetrics and Gynecology

## 2015-09-18 ENCOUNTER — Encounter (HOSPITAL_COMMUNITY): Payer: Self-pay | Admitting: *Deleted

## 2015-09-18 DIAGNOSIS — Z3A30 30 weeks gestation of pregnancy: Secondary | ICD-10-CM | POA: Diagnosis not present

## 2015-09-18 DIAGNOSIS — O4703 False labor before 37 completed weeks of gestation, third trimester: Secondary | ICD-10-CM

## 2015-09-18 DIAGNOSIS — K0889 Other specified disorders of teeth and supporting structures: Secondary | ICD-10-CM | POA: Diagnosis not present

## 2015-09-18 HISTORY — DX: Anemia, unspecified: D64.9

## 2015-09-18 MED ORDER — NIFEDIPINE 10 MG PO CAPS: 10.0000 mg | ORAL_CAPSULE | Freq: Four times a day (QID) | ORAL | Status: DC | PRN

## 2015-09-18 MED ORDER — NIFEDIPINE 10 MG PO CAPS
10.0000 mg | ORAL_CAPSULE | Freq: Once | ORAL | Status: AC
  Administered 2015-09-18: 10 mg via ORAL
  Filled 2015-09-18: qty 1

## 2015-09-18 NOTE — MAU Note (Signed)
Pt states she has been having uc's since last week, became very painful today.  Pt noted spotting with wiping today, denies LOF.

## 2015-09-18 NOTE — MAU Provider Note (Signed)
History    24 yo, G2P1001, 30.1 ega  [facility] today.  Pt  While in the office she c/o lower back pain and braxton hicks contracts over the past week that have become painful today.  NST in office today noted contrations q 5-7 minutes reported to palpated mild. Yeast infection also noted in office with cervical friability. SVE 0/0/-4 in office, with external Cx 1cm.   Currently pt c/o contractions 3-4 min and tooth pain.  States everytime her baby moves, her tooth hurts. States +FM and denies LOF.        Patient Active Problem List   Diagnosis Date Noted  . NSVD (normal spontaneous vaginal delivery) 06/18/2013  . Acute blood loss anemia 06/18/2013  . Pregnancy complicated by cervical shortening 06/18/2013    Chief Complaint  Patient presents with  . Abdominal Pain  . Vaginal Bleeding   HPI  OB History    Gravida Para Term Preterm AB TAB SAB Ectopic Multiple Living   2 1 1       1       Past Medical History  Diagnosis Date  . Hx of gonorrhea   . Hx of chlamydia infection   . Bell's palsy 2014  . Anemia     Past Surgical History  Procedure Laterality Date  . No past surgeries      Family History  Problem Relation Age of Onset  . Diabetes Mother   . Hypertension Mother   . Kidney disease Mother   . Alcohol abuse Neg Hx   . Arthritis Neg Hx   . Asthma Neg Hx   . Birth defects Neg Hx   . Cancer Neg Hx   . COPD Neg Hx   . Depression Neg Hx   . Drug abuse Neg Hx   . Early death Neg Hx   . Hearing loss Neg Hx   . Heart disease Neg Hx   . Hyperlipidemia Neg Hx   . Learning disabilities Neg Hx   . Mental illness Neg Hx   . Mental retardation Neg Hx   . Miscarriages / Stillbirths Neg Hx   . Stroke Neg Hx   . Vision loss Neg Hx     Social History  Substance Use Topics  . Smoking status: Never Smoker   . Smokeless tobacco: Never Used  . Alcohol Use: No    Allergies: No Known Allergies  Prescriptions prior to admission   Medication Sig Dispense Refill Last Dose  . acetaminophen (TYLENOL) 650 MG CR tablet Take 650 mg by mouth every 8 (eight) hours as needed for pain.   09/18/2015 at Unknown time  . Hydrocodone-Acetaminophen 5-300 MG TABS Take 1 tablet by mouth at bedtime.   09/17/2015 at Unknown time  . IRON PO Take 1 tablet by mouth daily.   Past Week at Unknown time  . Prenatal Vit-Fe Fumarate-FA (PRENATAL MULTIVITAMIN) TABS tablet Take 1 tablet by mouth daily at 12 noon.   09/17/2015 at Unknown time  . fluconazole (DIFLUCAN) 150 MG tablet Take 1 tablet (150 mg total) by mouth daily. Repeat dose in 3 days (Patient not taking: Reported on 09/18/2015) 2 tablet 0   . metroNIDAZOLE (FLAGYL) 500 MG tablet Take 1 tablet (500 mg total) by mouth 2 (two) times daily. (Patient not taking: Reported on 09/18/2015) 14 tablet 0   . phenazopyridine (PYRIDIUM) 100 MG tablet Take 1-2 tablets (100-200 mg total) by mouth 3 (three) times daily as needed for  pain. (Patient not taking: Reported on 09/18/2015) 30 tablet 0 Not Taking at Unknown time    Review of Systems  Constitutional: Negative.   HENT:       C/o toothache  Eyes: Negative.   Respiratory: Negative.   Cardiovascular: Negative.   Gastrointestinal:       C/o contractions  Genitourinary: Negative.   Musculoskeletal: Negative.   Skin: Negative.   Neurological: Negative.   Endo/Heme/Allergies: Negative.   Psychiatric/Behavioral: Negative.    Physical Exam   Blood pressure 111/68, pulse 100, temperature 97.6 F (36.4 C), temperature source Oral, resp. rate 18, last menstrual period 01/19/2015.    Physical Exam  Constitutional: She is oriented to person, place, and time. She appears well-developed and well-nourished.  HENT:  Head: Normocephalic and atraumatic.  Eyes: Pupils are equal, round, and reactive to light.  Neck: Normal range of motion. Neck supple.  Cardiovascular: Normal rate and regular rhythm.   Respiratory: Effort normal and breath sounds  normal.  GI: Soft. Bowel sounds are normal.  Musculoskeletal: Normal range of motion.  Neurological: She is alert and oriented to person, place, and time. She has normal reflexes.  Skin: Skin is warm and dry.  Psychiatric: She has a normal mood and affect. Her behavior is normal. Judgment and thought content normal.   UC- irritability noted FHR  Cat 1, 120 bpm, moderate variability, +accels, no decels    ED Course  Assessment: IUP at 30.1  No contractions noted after 10 mg procardia Cat 1 tracing    Plan: Discharge home Procardia  10 mg q 6 hrs, PRN Activity restrictions, rest  No sexual activity  Call if contractions continues or worsen If FfN results are positive, we will call you for follow up     Alphonzo Severance CNM, MSN 09/18/2015 5:56 PM

## 2015-09-18 NOTE — Discharge Instructions (Signed)

## 2015-10-26 LAB — OB RESULTS CONSOLE GBS: GBS: NEGATIVE

## 2015-10-29 NOTE — L&D Delivery Note (Signed)
Final Labor Progress Note At 0900 SROM noted.  VE C/C/+2.  FHR remained reassuring with occasional early decelerations.  Vaginal Delivery Note The pt utilized an epidural as pain management.   Spontaneous rupture of membranes today, at 0900, clear.  GBS was negative, .  Cervical dilation was complete at  0836  NICHD Category 1.    Pushing with guidance began at  905   After 3 minutes of pushing the head, shoulders and the body of a viable female infant "name undecided " delivered spontaneously with maternal effort in the ROA position at 0908.With vigorous tone and spontaneous cry, the infant was placed on moms abd. After the umbilical cord was clamped it was cut by the FOB, then cord blood was obtained for evaluation.Spontaneous delivery of a intact placenta with a 3 vessel cord via Shultz at  [phone].   Episiotomy: None   The vulva, perineum, vaginal vault, rectum and cervix were inspected and revealed a 1st degree bilateral labial  which was repaired using a 4-0 vicryl on a SH.Lidocaine was not used, the epidural was sufficient for the repair.  The rectum sphincter intact after the repair.  Patient tolerated repair well.   Postpartum pitocin as ordered.   EBL 100, Pt hemodynamically stable.  Sponge, laps and needle count correct and verified with the primary care nurse.  Attending MD available at all times.    Routine postpartum orders Mother unsure about method of contraception Mom plans to breastfeed. Infant to have outpatient circumcision   Placenta to pathology: NO     Cord Gases sent to lab: NO Cord blood sent to lab: YES   APGARS:  8 at 1 minute and 9 at 5 minutes Weight:.    Both mom and baby were left in stable condition, baby skin to skin.   Alphonzo Severance, CNM, MSN 11/23/2015. 9:43 AM      *

## 2015-11-23 ENCOUNTER — Encounter (HOSPITAL_COMMUNITY): Payer: Self-pay

## 2015-11-23 ENCOUNTER — Inpatient Hospital Stay (HOSPITAL_COMMUNITY)
Admission: RE | Admit: 2015-11-23 | Discharge: 2015-11-24 | DRG: 775 | Disposition: A | Discharge status: Home / Self Care | Payer: Medicaid Other | Source: Ambulatory Visit | Attending: Obstetrics and Gynecology | Admitting: Obstetrics and Gynecology

## 2015-11-23 ENCOUNTER — Inpatient Hospital Stay (HOSPITAL_COMMUNITY): Payer: Medicaid Other | Admitting: Anesthesiology

## 2015-11-23 DIAGNOSIS — D649 Anemia, unspecified: Secondary | ICD-10-CM | POA: Diagnosis present

## 2015-11-23 DIAGNOSIS — Z833 Family history of diabetes mellitus: Secondary | ICD-10-CM | POA: Diagnosis not present

## 2015-11-23 DIAGNOSIS — O4103X0 Oligohydramnios, third trimester, not applicable or unspecified: Secondary | ICD-10-CM | POA: Diagnosis present

## 2015-11-23 DIAGNOSIS — O4100X0 Oligohydramnios, unspecified trimester, not applicable or unspecified: Secondary | ICD-10-CM | POA: Diagnosis present

## 2015-11-23 DIAGNOSIS — Z3A39 39 weeks gestation of pregnancy: Secondary | ICD-10-CM | POA: Diagnosis not present

## 2015-11-23 DIAGNOSIS — Z8249 Family history of ischemic heart disease and other diseases of the circulatory system: Secondary | ICD-10-CM

## 2015-11-23 DIAGNOSIS — O9902 Anemia complicating childbirth: Secondary | ICD-10-CM | POA: Diagnosis present

## 2015-11-23 LAB — TYPE AND SCREEN
ABO/RH(D): O POS
ANTIBODY SCREEN: NEGATIVE

## 2015-11-23 LAB — RAPID HIV SCREEN (HIV 1/2 AB+AG)
HIV 1/2 Antibodies: NONREACTIVE
HIV-1 P24 Antigen - HIV24: NONREACTIVE

## 2015-11-23 LAB — CBC
HCT: 30.2 % — ABNORMAL LOW (ref 36.0–46.0)
Hemoglobin: 10.1 g/dL — ABNORMAL LOW (ref 12.0–15.0)
MCH: 26.6 pg (ref 26.0–34.0)
MCHC: 33.4 g/dL (ref 30.0–36.0)
MCV: 79.7 fL (ref 78.0–100.0)
PLATELETS: 326 10*3/uL (ref 150–400)
RBC: 3.79 MIL/uL — ABNORMAL LOW (ref 3.87–5.11)
RDW: 15 % (ref 11.5–15.5)
WBC: 16.7 10*3/uL — AB (ref 4.0–10.5)

## 2015-11-23 LAB — ABO/RH: ABO/RH(D): O POS

## 2015-11-23 MED ORDER — OXYTOCIN 10 UNIT/ML IJ SOLN
1.0000 m[IU]/min | INTRAMUSCULAR | Status: DC
  Administered 2015-11-23: 1 m[IU]/min via INTRAVENOUS
  Filled 2015-11-23: qty 4

## 2015-11-23 MED ORDER — FENTANYL CITRATE (PF) 100 MCG/2ML IJ SOLN
50.0000 ug | INTRAMUSCULAR | Status: DC | PRN
  Filled 2015-11-23: qty 2

## 2015-11-23 MED ORDER — DIPHENHYDRAMINE HCL 50 MG/ML IJ SOLN: 12.5000 mg | INTRAMUSCULAR | Status: DC | PRN

## 2015-11-23 MED ORDER — BENZOCAINE-MENTHOL 20-0.5 % EX AERO
1.0000 "application " | INHALATION_SPRAY | CUTANEOUS | Status: DC | PRN
  Administered 2015-11-23 – 2015-11-24 (×2): 1 via TOPICAL
  Filled 2015-11-23 (×2): qty 56

## 2015-11-23 MED ORDER — LIDOCAINE HCL (PF) 1 % IJ SOLN
30.0000 mL | INTRAMUSCULAR | Status: DC | PRN
  Filled 2015-11-23: qty 30

## 2015-11-23 MED ORDER — ACETAMINOPHEN 325 MG PO TABS: 650.0000 mg | ORAL_TABLET | ORAL | Status: DC | PRN

## 2015-11-23 MED ORDER — LANOLIN HYDROUS EX OINT: TOPICAL_OINTMENT | CUTANEOUS | Status: DC | PRN

## 2015-11-23 MED ORDER — CITRIC ACID-SODIUM CITRATE 334-500 MG/5ML PO SOLN: 30.0000 mL | ORAL | Status: DC | PRN

## 2015-11-23 MED ORDER — PHENYLEPHRINE 40 MCG/ML (10ML) SYRINGE FOR IV PUSH (FOR BLOOD PRESSURE SUPPORT)
80.0000 ug | PREFILLED_SYRINGE | INTRAVENOUS | Status: DC | PRN
  Filled 2015-11-23: qty 2
  Filled 2015-11-23: qty 20

## 2015-11-23 MED ORDER — ZOLPIDEM TARTRATE 5 MG PO TABS: 5.0000 mg | ORAL_TABLET | Freq: Every evening | ORAL | Status: DC | PRN

## 2015-11-23 MED ORDER — EPHEDRINE 5 MG/ML INJ
10.0000 mg | INTRAVENOUS | Status: DC | PRN
  Filled 2015-11-23: qty 2

## 2015-11-23 MED ORDER — LACTATED RINGERS IV SOLN
INTRAVENOUS | Status: DC
  Administered 2015-11-23: 04:00:00 via INTRAVENOUS

## 2015-11-23 MED ORDER — WITCH HAZEL-GLYCERIN EX PADS
1.0000 "application " | MEDICATED_PAD | CUTANEOUS | Status: DC | PRN
  Administered 2015-11-24: 1 via TOPICAL

## 2015-11-23 MED ORDER — IBUPROFEN 600 MG PO TABS
600.0000 mg | ORAL_TABLET | Freq: Four times a day (QID) | ORAL | Status: DC
  Administered 2015-11-23 – 2015-11-24 (×5): 600 mg via ORAL
  Filled 2015-11-23 (×5): qty 1

## 2015-11-23 MED ORDER — SENNOSIDES-DOCUSATE SODIUM 8.6-50 MG PO TABS
2.0000 | ORAL_TABLET | ORAL | Status: DC
  Administered 2015-11-24: 2 via ORAL
  Filled 2015-11-23: qty 2

## 2015-11-23 MED ORDER — LIDOCAINE-EPINEPHRINE (PF) 2 %-1:200000 IJ SOLN
INTRAMUSCULAR | Status: DC | PRN
  Administered 2015-11-23: 5 mL

## 2015-11-23 MED ORDER — OXYTOCIN 10 UNIT/ML IJ SOLN
2.5000 [IU]/h | INTRAVENOUS | Status: DC
  Administered 2015-11-23: 2.5 [IU]/h via INTRAVENOUS

## 2015-11-23 MED ORDER — DIPHENHYDRAMINE HCL 25 MG PO CAPS: 25.0000 mg | ORAL_CAPSULE | Freq: Four times a day (QID) | ORAL | Status: DC | PRN

## 2015-11-23 MED ORDER — TERBUTALINE SULFATE 1 MG/ML IJ SOLN
0.2500 mg | Freq: Once | INTRAMUSCULAR | Status: DC | PRN
  Filled 2015-11-23: qty 1

## 2015-11-23 MED ORDER — OXYCODONE-ACETAMINOPHEN 5-325 MG PO TABS: 2.0000 | ORAL_TABLET | ORAL | Status: DC | PRN

## 2015-11-23 MED ORDER — ONDANSETRON HCL 4 MG/2ML IJ SOLN: 4.0000 mg | Freq: Four times a day (QID) | INTRAMUSCULAR | Status: DC | PRN

## 2015-11-23 MED ORDER — ONDANSETRON HCL 4 MG/2ML IJ SOLN: 4.0000 mg | INTRAMUSCULAR | Status: DC | PRN

## 2015-11-23 MED ORDER — DIBUCAINE 1 % RE OINT
1.0000 "application " | TOPICAL_OINTMENT | RECTAL | Status: DC | PRN
  Administered 2015-11-24: 1 via RECTAL
  Filled 2015-11-23: qty 28

## 2015-11-23 MED ORDER — FENTANYL 2.5 MCG/ML BUPIVACAINE 1/10 % EPIDURAL INFUSION (WH - ANES)
14.0000 mL/h | INTRAMUSCULAR | Status: DC | PRN
Start: 2015-11-23 — End: 2015-11-23
  Administered 2015-11-23: 14 mL/h via EPIDURAL
  Filled 2015-11-23: qty 125

## 2015-11-23 MED ORDER — TETANUS-DIPHTH-ACELL PERTUSSIS 5-2.5-18.5 LF-MCG/0.5 IM SUSP: 0.5000 mL | Freq: Once | INTRAMUSCULAR | Status: DC

## 2015-11-23 MED ORDER — SIMETHICONE 80 MG PO CHEW: 80.0000 mg | CHEWABLE_TABLET | ORAL | Status: DC | PRN

## 2015-11-23 MED ORDER — OXYCODONE-ACETAMINOPHEN 5-325 MG PO TABS: 1.0000 | ORAL_TABLET | ORAL | Status: DC | PRN

## 2015-11-23 MED ORDER — OXYTOCIN BOLUS FROM INFUSION
500.0000 mL | INTRAVENOUS | Status: DC
  Administered 2015-11-23: 500 mL via INTRAVENOUS

## 2015-11-23 MED ORDER — PRENATAL MULTIVITAMIN CH
1.0000 | ORAL_TABLET | Freq: Every day | ORAL | Status: DC
  Administered 2015-11-24: 1 via ORAL
  Filled 2015-11-23: qty 1

## 2015-11-23 MED ORDER — ONDANSETRON HCL 4 MG PO TABS: 4.0000 mg | ORAL_TABLET | ORAL | Status: DC | PRN

## 2015-11-23 MED ORDER — LACTATED RINGERS IV SOLN: 500.0000 mL | INTRAVENOUS | Status: DC | PRN

## 2015-11-23 MED ORDER — SODIUM BICARBONATE 8.4 % IV SOLN
INTRAVENOUS | Status: DC | PRN
  Administered 2015-11-23: 5 mL via EPIDURAL

## 2015-11-23 NOTE — Anesthesia Procedure Notes (Signed)
Epidural Patient location during procedure: OB  Staffing Anesthesiologist: Brittania Sudbeck Performed by: anesthesiologist   Preanesthetic Checklist Completed: patient identified, surgical consent, pre-op evaluation, timeout performed, IV checked, risks and benefits discussed and monitors and equipment checked  Epidural Patient position: sitting Prep: DuraPrep Patient monitoring: heart rate, cardiac monitor, continuous pulse ox and blood pressure Approach: midline Location: L4-L5 Injection technique: LOR saline  Needle:  Needle type: Tuohy  Needle gauge: 17 G Needle length: 9 cm Needle insertion depth: 9 cm Catheter type: closed end flexible Catheter size: 19 Gauge Catheter at skin depth: 15 cm Test dose: negative and 2% lidocaine with Epi 1:200 K  Assessment Events: blood not aspirated, injection not painful, no injection resistance, negative IV test and no paresthesia  Additional Notes Reason for block:procedure for pain   

## 2015-11-23 NOTE — Lactation Note (Signed)
This note was copied from the chart of Boy Lizbet Cirrincione. Lactation Consultation Note  Patient Name: Boy Markeshia Giebel [id number] Date: 11/23/2015 Reason for consult: Initial assessment   With this mom of a term baby, now 64 hours old. Mom and her 23 year old and newborn ( in the crib), were all sleeping. Visitor said breastfeeding going well. I told the visitor to call for lactation when she was awake, and when needed.    Maternal Data Formula Feeding for Exclusion: Yes Reason for exclusion: Mother's choice to formula and breast feed on admission Has patient been taught Hand Expression?: No Does the patient have breastfeeding experience prior to this delivery?: Yes  Feeding Feeding Type: Breast Fed Length of feed: 15 min  LATCH Score/Interventions                      Lactation Tools Discussed/Used     Consult Status Consult Status: Follow-up Date: 11/23/15 Follow-up type: In-patient    Alfred Levins 11/23/2015, 4:00 PM

## 2015-11-23 NOTE — Anesthesia Preprocedure Evaluation (Signed)
Anesthesia Evaluation  Patient identified by MRN, date of birth, ID band Patient awake    Reviewed: Allergy & Precautions, Patient's Chart, lab work & pertinent test results  History of Anesthesia Complications Negative for: history of anesthetic complications  Airway Mallampati: I  TM Distance: >3 FB Neck ROM: Full    Dental  (+) Teeth Intact   Pulmonary neg pulmonary ROS,    breath sounds clear to auscultation       Cardiovascular negative cardio ROS   Rhythm:Regular     Neuro/Psych negative neurological ROS  negative psych ROS   GI/Hepatic negative GI ROS, Neg liver ROS,   Endo/Other  negative endocrine ROS  Renal/GU negative Renal ROS     Musculoskeletal   Abdominal   Peds  Hematology  (+) anemia ,   Anesthesia Other Findings   Reproductive/Obstetrics (+) Pregnancy                             Anesthesia Physical Anesthesia Plan  ASA: II  Anesthesia Plan: Epidural   Post-op Pain Management:    Induction:   Airway Management Planned:   Additional Equipment:   Intra-op Plan:   Post-operative Plan:   Informed Consent: I have reviewed the patients History and Physical, chart, labs and discussed the procedure including the risks, benefits and alternatives for the proposed anesthesia with the patient or authorized representative who has indicated his/her understanding and acceptance.   Dental advisory given  Plan Discussed with: Anesthesiologist  Anesthesia Plan Comments:         Anesthesia Quick Evaluation  

## 2015-11-23 NOTE — Progress Notes (Signed)
Subjective: Comfortable and sleepy S/p epidural placement. States. "I just want to take a nap".   Objective: BP 111/58 mmHg  Pulse 85  Temp(Src) 98.1 F (36.7 C) (Axillary)  Resp 22  Ht  (1.753 m)  Wt 85.276 kg (188 lb)  BMI 27.75 kg/m2  SpO2 100%  LMP 01/19/2015      FHT: Category 2, 115 bpm, moderate variability, +accels, occasional decelerations UC:   regular, every 2-3 minutes SVE:   Dilation: 10 Effacement (%): 100 Station: +1 Exam by:: Sheryn Bison, CNM Membranes intact Pitocin at 3 milliunits/ min  Assessment:  IUP at 39.4wks Second stage labor Cat 2 FT  With moderate variability Oligohydramnios GBS Negative IOL   Plan: Epidural very dense-Pt to nap for 15 min Will labor down for 15 min and then AROM Anticipate SVD  Continue all other management as ordered   Alphonzo Severance CNM, [place] 11/23/2015, 8:42 AM

## 2015-11-23 NOTE — H&P (Signed)
Suzanne Lamb is a 25 y.o. female, G3P1011 at 39.4 weeks, presenting for IOL secondary to oligohydramnios.  Patient seen in office on Jan 25 and AFI was 5cm.  Patient pregnancy history significant for late entry to care, intrauterine synechiae, and history of short cervix that was stable t/o this pregnancy.    Patient Active Problem List   Diagnosis Date Noted  . NSVD (normal spontaneous vaginal delivery) 06/18/2013  . Acute blood loss anemia 06/18/2013  . Pregnancy complicated by cervical shortening 06/18/2013    History of present pregnancy: Patient entered care at 14.5 weeks.   EDC of 11/26/2015 was established by Definite LMP of 02/19/2015.   Anatomy scan:  20.1 weeks, with normal findings and an anterior placenta.   Additional [place] evaluations:   17.4wks: +FHT's Anterior placenta. Normal fluid. Cervix closed long. No funneling  Anatomy: normal except spine and right hand not well seen EFW:14 oz 89%tile cx 3.69 cm cx closed: possible LUS synechia. Anterior placenta, placental lake seen. 24.2wks: Anatomy u/s anterior placenta - normal fluid cervix appears dynamic: 2.82 cm Uterine synechia is noted - no signs of amnionic bands coronal spine seen, sagittal and trans spine not seen Technically difficult scan due to position. 26wks: u/s singleton, vertex, placenta anterior, spine seen. Anatomy c/c AFI=7.6 cervical length 4.0 27.4wks: U/S reviewed today AFI 11.52cm, cervix 3.28cm. 31.4wks: NORMAL GROWTH/AFI [place]  35.4wks:  Weight 5lb 12oz (2598g) 54th%, Anterior placenta, AFI -6.4 cm = <3% - Oligohydraminos  35.5wks: AFI TODAY 10, 20%ILE, WNL. VTX 37.2wks:  U/S: AFI 8.95cm 39.3wks: AFI 5, vtx. Significant prenatal events: 1st Trimester: No care 2nd Trimester: Patient c/o lower abdominal cramping.   Patient reports falling down at 19wks and experiencing increased abdominal pain-most likely muscle spasm.  3rd Trimester:  Patient with mild contractions at 30wks-fFN negative.   Patient with diagnosis  of yeast infection, but did not fill rx. Patient diagnosed with BV-given flagyl.  Last evaluation:  11/22/2015 in office by Dr. Alinda Sierras. FHR 130, VE 4/50/-2, BP 98/60, Wt 188.25lbs.  TWG: 33.25  OB History    Gravida Para Term Preterm AB TAB SAB Ectopic Multiple Living   05/2013: Female via SVD 7lbs 2oz 10/2013: 8wk SAB  Past Medical History  Diagnosis Date  . Hx of gonorrhea   . Hx of chlamydia infection   . Bell's palsy 2014  . Anemia    Past Surgical History  Procedure Laterality Date  . No past surgeries     Family History: family history includes Diabetes in her mother; Hypertension in her mother; Kidney disease in her mother. There is no history of Alcohol abuse, Arthritis, Asthma, Birth defects, Cancer, COPD, Depression, Drug abuse, Early death, Hearing loss, Heart disease, Hyperlipidemia, Learning disabilities, Mental illness, Mental retardation, Miscarriages / Stillbirths, Stroke, or Vision loss. Social History:  reports that she has never smoked. She has never used smokeless tobacco. She reports that she does not drink alcohol or use illicit drugs.  Patient is an african [place] with a 2 year college education.  She is employed as a CSR and identifies as of the [place] religion.  Patient partner is Harland Dingwall who is present and supportive.   Prenatal Transfer Tool  Maternal Diabetes: No Genetic Screening: Normal Maternal Ultrasounds/Referrals: Abnormal:  Findings:   Other: Occasional Low AFI Fetal Ultrasounds or other Referrals:  None Maternal Substance Abuse:  No Significant Maternal Medications:  None Significant Maternal Lab Results:  Lab values include: Group B Strep negative    ROS:  +FM, -Ctx, -Lof, -Vb, No n/v, issues with constipation, diarrhea, or urination No recent illness  No Known Allergies     Last menstrual period 01/19/2015.  Physical Exam  Vitals reviewed. Constitutional: She is oriented to person, place, and time. She appears  well-developed and well-nourished. No distress.  HENT:  Head: Normocephalic and atraumatic.  Eyes: EOM are normal.  Neck: Normal range of motion.  Cardiovascular: Normal rate, regular rhythm and normal heart sounds.   Respiratory: Effort normal and breath sounds normal.  GI: Soft. Bowel sounds are normal.  Gravid--fundal height appears AGA, Soft, NT  Genitourinary:  SVE: 5/50/-2, Soft, Middle  Musculoskeletal: Normal range of motion. She exhibits no edema.  Neurological: She is alert and oriented to person, place, and time.  Skin: Skin is warm and dry.    [place]: EFW: 7lbs Presentation: Vertex  FHR: 125 bpm, Mod Var, -Decels, +Accels UCs:  Q  Prenatal labs: ABO, Rh:  O Positive Antibody:  Negative Rubella:  Immune RPR:   NR HBsAg:  Negative  HIV:   NR GBS:  Negative Sickle cell/Hgb electrophoresis:  Normal Pap:  Normal 05/2015 GC:  Negative Chlamydia:  Negative Other:  None    Assessment IUP at 39.4wks Cat I FT Oligohydramnios GBS Negative IOL Bishop Score 8   Plan: Admit to [facility] per consult with Dr. Alinda Sierras prior to scheduling induction Routine Labor and Delivery Orders per [facility] Protocol Routine Augmentation/Induction Orders  In room to complete assessment and discuss POC: Will start pitocin now at 1x1 Educated regarding pitocin including r/b Patient reports she is unaware of contractions Reports some anxiety regarding induction process Q/C addressed Okay for epidural if and when desired Discussed availability of IV pain medications Dr.EV to be updated as appropriate  Joellyn Quails, MSN 11/23/2015, 12:20 AM

## 2015-11-23 NOTE — Progress Notes (Addendum)
Subjective: In to meet the acquaintance of patient.   Pt very uncomfortable and starting to feel like she has to push.   Objective: BP 105/62 mmHg  Pulse 86  Temp(Src) 98 F (36.7 C) (Oral)  Resp 18  Ht  (1.753 m)  Wt 85.276 kg (188 lb)  BMI 27.75 kg/m2  LMP 01/19/2015      FHT: Category 1, BL 115 bpm, moderate variability, +accels, no decels UC:   regular, every 2 minutes SVE:   Dilation: 8 Effacement (%): 80 Station: 0 Exam by:: Sheryn Bison, CNM Pitocin at 3 milliunits/min membranes intact  Assessment:  IUP at 39.4wks Transitional labor Cat I FT Oligohydramnios GBS Negative IOL    Plan: Hold IV pain medication, may have Epidural  Will consider AROM once comfortable with epidural Anticipate SVD  Continue all other management as ordered   Alphonzo Severance CNM, [place] 11/23/2015, 7:40 AM

## 2015-11-24 LAB — CBC
HEMATOCRIT: 28.3 % — AB (ref 36.0–46.0)
Hemoglobin: 9.4 g/dL — ABNORMAL LOW (ref 12.0–15.0)
MCH: 26.8 pg (ref 26.0–34.0)
MCHC: 33.2 g/dL (ref 30.0–36.0)
MCV: 80.6 fL (ref 78.0–100.0)
Platelets: 281 10*3/uL (ref 150–400)
RBC: 3.51 MIL/uL — ABNORMAL LOW (ref 3.87–5.11)
RDW: 15.5 % (ref 11.5–15.5)
WBC: 20.2 10*3/uL — ABNORMAL HIGH (ref 4.0–10.5)

## 2015-11-24 LAB — RPR: RPR: NONREACTIVE

## 2015-11-24 MED ORDER — FERROUS SULFATE 325 (65 FE) MG PO TABS
325.0000 mg | ORAL_TABLET | Freq: Every day | ORAL | Status: DC
  Administered 2015-11-24: 325 mg via ORAL

## 2015-11-24 NOTE — Progress Notes (Signed)
Subjective: Postpartum Day 1: Vaginal delivery, 1st degree bilateral labial lacerations Patient up ad lib, reports no syncope or dizziness. Feeding:  Breast/bottle Contraceptive plan:  Considering Nexplanon or IUD  May be interested in d/c today--patient will discuss with peds.  Objective: Vital signs in last 24 hours: Temp:  [97.6 F (36.4 C)-98.7 F (37.1 C)] 97.7 F (36.5 C) (01/27 0500) Pulse Rate:  [68-120] 90 (01/27 0500) Resp:  [16-24] 18 (01/27 0500) BP: (82-128)/(36-83) 114/72 mmHg (01/27 0500) SpO2:  [100 %] 100 % (01/26 1614)  Physical Exam:  General: alert Lochia: appropriate Uterine Fundus: firm Perineum: healing well DVT Evaluation: No evidence of DVT seen on physical exam. Negative Homan's sign.   CBC Latest Ref Rng 11/24/2015 11/23/2015 07/09/2013  WBC 4.0 - 10.5 K/uL 20.2(H) 16.7(H) 10.6(H)  Hemoglobin 12.0 - 15.0 g/dL 1.6(X) 10.1(L) 12.4  Hematocrit 36.0 - 46.0 % 28.3(L) 30.2(L) 34.3(L)  Platelets 150 - 400 K/uL 281 326 433(H)     Assessment/Plan: Status post vaginal delivery day 1. Chronic anemia--stable orthostatically Stable Continue current care. Await assessment by peds for possible d/c. Will d/c home with information on Nexplanon and IUD    Nyra Capes 11/24/2015, 8:17 AM

## 2015-11-24 NOTE — Discharge Instructions (Signed)

## 2015-11-24 NOTE — Anesthesia Postprocedure Evaluation (Signed)
Anesthesia Post Note  Patient: Suzanne Lamb  Procedure(s) Performed: * No procedures listed *  Patient location during evaluation: Mother Baby Anesthesia Type: Epidural Level of consciousness: awake and alert, oriented and patient cooperative Pain management: pain level controlled Vital Signs Assessment: post-procedure vital signs reviewed and stable Respiratory status: spontaneous breathing Cardiovascular status: stable Postop Assessment: no headache, epidural receding, patient able to bend at knees and no signs of nausea or vomiting Anesthetic complications: no    Last Vitals:  Filed Vitals:   11/24/15 0400 11/24/15 0500  BP: 121/61 114/72  Pulse: 98 90  Temp: 37.1 C 36.5 C  Resp: 16 18    Last Pain:  Filed Vitals:   11/24/15 0551  PainSc: 3                  Kizzie Cotten

## 2015-11-24 NOTE — Discharge Summary (Signed)
OB Discharge Summary     Patient Name: Suzanne Lamb DOB: [birth year] MRN: [id number]  Date of admission: 11/23/2015 Delivering MD: Alphonzo Severance   Date of discharge: 11/24/2015  Admitting diagnosis: INDUCTION Intrauterine pregnancy: [redacted]w[redacted]d     Secondary diagnosis:  Principal Problem:   Spontaneous vaginal delivery Active Problems:   Oligohydramnios  Additional problems: None     Discharge diagnosis: Term Pregnancy Delivered                                                                                                Post partum procedures:None  Augmentation: AROM and Pitocin  Complications: None  Hospital course:  Onset of Labor With Vaginal Delivery     25 y.o. yo [id number] at [redacted]w[redacted]d was admitted in Active Labor on 11/23/2015. Patient had an uncomplicated labor course as follows:  Membrane Rupture Time/Date: 9:00 AM ,11/23/2015   Intrapartum Procedures: Episiotomy: None [1]                                         Lacerations:  1st degree [2];Labial [10]  Patient had a delivery of a Viable infant. 11/23/2015  Information for the patient's newborn:  Mindy, Gali [[id number]]  Delivery Method: Vaginal, Spontaneous Delivery (Filed from Delivery Summary)    Pateint had an uncomplicated postpartum course.  She is ambulating, tolerating a regular diet, passing flatus, and urinating well. Patient is discharged home in stable condition on 11/24/2015. She is to continue her iron supplementation.   Physical exam  Filed Vitals:   11/23/15 1614 11/23/15 2015 11/24/15 0400 11/24/15 0500  BP: 98/59 98/69 121/61 114/72  Pulse: 100 88 98 90  Temp: 98.2 F (36.8 C) 98 F (36.7 C) 98.7 F (37.1 C) 97.7 F (36.5 C)  TempSrc: Oral Oral Oral   Resp: Height:      Weight:      SpO2: 100%      General: alert Lochia: appropriate Uterine Fundus: firm Incision: Healing well with no significant drainage DVT Evaluation: No evidence of DVT seen on physical exam. Negative  Homan's sign.  Labs: CBC Latest Ref Rng 11/24/2015 11/23/2015 07/09/2013  WBC 4.0 - 10.5 K/uL 20.2(H) 16.7(H) 10.6(H)  Hemoglobin 12.0 - 15.0 g/dL 1.3(Y) 10.1(L) 12.4  Hematocrit 36.0 - 46.0 % 28.3(L) 30.2(L) 34.3(L)  Platelets 150 - 400 K/uL 281 326 433(H)    CMP Latest Ref Rng 07/09/2013  Glucose 70 - 99 mg/dL 88  BUN 6 - 23 mg/dL 5(L)  Creatinine 8.65 - 1.10 mg/dL 7.84  Sodium 696 - 295 mEq/L 141  Potassium 3.5 - 5.1 mEq/L 3.5  Chloride 96 - 112 mEq/L 104  CO2 19 - 32 mEq/L 28  Calcium 8.4 - 10.5 mg/dL 9.2  Total Protein 6.0 - 8.3 g/dL 7.6  Total Bilirubin 0.3 - 1.2 mg/dL 0.8  Alkaline Phos 39 - 117 U/L 94  AST 0 - 37 U/L 28  ALT 0 - 35 U/L 16    Discharge  instruction: per After Visit Summary and "Baby and Me Booklet".  After visit meds:    Medication List    STOP taking these medications        NIFEdipine 10 MG capsule  Commonly known as:  PROCARDIA      TAKE these medications        acetaminophen 325 MG tablet  Commonly known as:  TYLENOL  Take 650 mg by mouth every 6 (six) hours as needed for mild pain or moderate pain.     IRON PO  Take 1 tablet by mouth daily.     prenatal multivitamin Tabs tablet  Take 1 tablet by mouth daily at 12 noon.        Diet: routine diet  Activity: Advance as tolerated. Pelvic rest for 6 weeks.   Outpatient follow up:6 weeks Follow up Appt:No future appointments. Follow up Visit:No Follow-up on file.  Postpartum contraception: Nexplanon and IUD Mirena  Newborn Data: Live born female  Birth Weight: 7 lb 2.3 oz (3240 g) APGAR: 8, 9  Baby Feeding: Breast/bottle Disposition:home with mother   11/24/2015 Nigel Bridgeman, CNM

## 2016-12-22 ENCOUNTER — Encounter (HOSPITAL_COMMUNITY): Payer: Self-pay | Admitting: Emergency Medicine

## 2016-12-22 ENCOUNTER — Ambulatory Visit (HOSPITAL_COMMUNITY)
Admission: EM | Admit: 2016-12-22 | Discharge: 2016-12-22 | Disposition: A | Discharge status: Home / Self Care | Payer: Self-pay | Attending: Family Medicine | Admitting: Family Medicine

## 2016-12-22 DIAGNOSIS — B9689 Other specified bacterial agents as the cause of diseases classified elsewhere: Secondary | ICD-10-CM

## 2016-12-22 DIAGNOSIS — N760 Acute vaginitis: Secondary | ICD-10-CM | POA: Insufficient documentation

## 2016-12-22 DIAGNOSIS — Z8249 Family history of ischemic heart disease and other diseases of the circulatory system: Secondary | ICD-10-CM | POA: Insufficient documentation

## 2016-12-22 DIAGNOSIS — D649 Anemia, unspecified: Secondary | ICD-10-CM | POA: Insufficient documentation

## 2016-12-22 DIAGNOSIS — R51 Headache: Secondary | ICD-10-CM | POA: Insufficient documentation

## 2016-12-22 DIAGNOSIS — Z833 Family history of diabetes mellitus: Secondary | ICD-10-CM | POA: Insufficient documentation

## 2016-12-22 DIAGNOSIS — R110 Nausea: Secondary | ICD-10-CM | POA: Insufficient documentation

## 2016-12-22 DIAGNOSIS — R109 Unspecified abdominal pain: Secondary | ICD-10-CM | POA: Insufficient documentation

## 2016-12-22 DIAGNOSIS — N10 Acute tubulo-interstitial nephritis: Secondary | ICD-10-CM | POA: Insufficient documentation

## 2016-12-22 DIAGNOSIS — Z841 Family history of disorders of kidney and ureter: Secondary | ICD-10-CM | POA: Insufficient documentation

## 2016-12-22 DIAGNOSIS — Z79899 Other long term (current) drug therapy: Secondary | ICD-10-CM | POA: Insufficient documentation

## 2016-12-22 LAB — POCT URINALYSIS DIP (DEVICE)
Glucose, UA: NEGATIVE mg/dL
Ketones, ur: 15 mg/dL — AB
Nitrite: NEGATIVE
Protein, ur: >=300 mg/dL — AB
Specific Gravity, Urine: 1.02 (ref 1.005–1.030)
Urobilinogen, UA: 4 mg/dL — ABNORMAL HIGH (ref 0.0–1.0)
pH: 5.5 (ref 5.0–8.0)

## 2016-12-22 LAB — POCT PREGNANCY, URINE: Preg Test, Ur: NEGATIVE

## 2016-12-22 MED ORDER — PHENAZOPYRIDINE HCL 200 MG PO TABS: 200.0000 mg | ORAL_TABLET | Freq: Three times a day (TID) | ORAL | 0 refills | Status: DC | PRN

## 2016-12-22 MED ORDER — CEPHALEXIN 500 MG PO CAPS: 500.0000 mg | ORAL_CAPSULE | Freq: Four times a day (QID) | ORAL | 0 refills | Status: DC

## 2016-12-22 MED ORDER — METRONIDAZOLE 500 MG PO TABS: 500.0000 mg | ORAL_TABLET | Freq: Two times a day (BID) | ORAL | 0 refills | Status: DC

## 2016-12-22 MED ORDER — ONDANSETRON 4 MG PO TBDP: 4.0000 mg | ORAL_TABLET | Freq: Three times a day (TID) | ORAL | 0 refills | Status: DC | PRN

## 2016-12-22 NOTE — ED Triage Notes (Signed)
[facility] a [facility] a headache and nausea x 5 days.

## 2016-12-22 NOTE — Discharge Instructions (Signed)
To treat bacterial vaginosis, I have prescribed metronidazole. Take 1 tablet twice a day for 7 days. Do not drink any alcohol while taking this medicine as it can make you very ill.  I have also prescribed Keflex, take one tablet 4 times a day for 5 days. For pain I have prescribed Pyridium take one tablet 3 times a day for 2 days, this medication will stain your urine bright orange or red. For Nausea, I have prescribed Zofran, take 1 tablet under the tongue every 8 hours as needed. Your urine will be sent for culture, should anything grow out, or if you need additional therapy beyond what was prescribed tonight your be contacted in 3-5 business days. He also been screened for gonorrhea, chlamydia, bacteria known to cause bacterial vaginosis, and for yeast. Should your symptoms worsen, or fail to improve, follow up with her primary care provider, or return to clinic as needed.

## 2016-12-22 NOTE — ED Provider Notes (Signed)
CSN: [id number]     Arrival date & time 12/22/16  1853 History   First MD Initiated Contact with Patient 12/22/16 1911     Chief Complaint  Patient presents with  . Headache  . Nausea   (Consider location/radiation/quality/duration/timing/severity/associated sxs/prior Treatment) 26 year old female presents to clinic with 5 day history of nausea, abdominal pain, and left-sided flank pain. She reports having had a vaginal discharge, consistent with prior diagnosis of bacterial vaginitis. With regard to her flank pain, it is worse with movement, and was pressure. She denies dysuria, denies frequency, denies any foul odors, or itching. She is currently on her period. She denies fever, chills, or other symptoms.   The history is provided by the patient.    Past Medical History:  Diagnosis Date  . Anemia   . Bell's palsy 2014  . Hx of chlamydia infection   . Hx of gonorrhea    Past Surgical History:  Procedure Laterality Date  . NO PAST SURGERIES     Family History  Problem Relation Age of Onset  . Diabetes Mother   . Hypertension Mother   . Kidney disease Mother   . Alcohol abuse Neg Hx   . Arthritis Neg Hx   . Asthma Neg Hx   . Birth defects Neg Hx   . Cancer Neg Hx   . COPD Neg Hx   . Depression Neg Hx   . Drug abuse Neg Hx   . Early death Neg Hx   . Hearing loss Neg Hx   . Heart disease Neg Hx   . Hyperlipidemia Neg Hx   . Learning disabilities Neg Hx   . Mental illness Neg Hx   . Mental retardation Neg Hx   . Miscarriages / Stillbirths Neg Hx   . Stroke Neg Hx   . Vision loss Neg Hx    Social History  Substance Use Topics  . Smoking status: Never Smoker  . Smokeless tobacco: Never Used  . Alcohol use No   OB History    Gravida Para Term Preterm AB Living   2 2 2     2    SAB TAB Ectopic Multiple Live Births         0 2     Review of Systems  Reason unable to perform ROS: As covered in history of present illness.  All other systems reviewed and are  negative.   Allergies  Patient has no known allergies.  Home Medications   Prior to Admission medications   Medication Sig Start Date End Date Taking? Authorizing Provider  cephALEXin (KEFLEX) 500 MG capsule Take 1 capsule (500 mg total) by mouth 4 (four) times daily. 12/22/16   Dorena Bodo, NP  metroNIDAZOLE (FLAGYL) 500 MG tablet Take 1 tablet (500 mg total) by mouth 2 (two) times daily. 12/22/16   Dorena Bodo, NP  ondansetron (ZOFRAN ODT) 4 MG disintegrating tablet Take 1 tablet (4 mg total) by mouth every 8 (eight) hours as needed for nausea or vomiting. 12/22/16   Dorena Bodo, NP  phenazopyridine (PYRIDIUM) 200 MG tablet Take 1 tablet (200 mg total) by mouth 3 (three) times daily as needed for pain. 12/22/16   Dorena Bodo, NP   Meds Ordered and Administered this Visit  Medications - No data to display  BP 101/57 (BP Location: Right Arm)   Pulse 108   Temp 97.9 F (36.6 C) (Oral)   Resp 16   SpO2 100%   Breastfeeding? No  No data found.  Physical Exam  Constitutional: She is oriented to person, place, and time. She appears well-developed and well-nourished. No distress.  HENT:  Head: Normocephalic and atraumatic.  Cardiovascular: Normal rate and regular rhythm.   Pulmonary/Chest: Effort normal and breath sounds normal.  Abdominal: Soft. Bowel sounds are normal. She exhibits no distension. There is no tenderness. There is CVA tenderness (Left-sided CVA tenderness). There is no guarding.  Genitourinary:  Genitourinary Comments: Deferred, urine cytology collected  Neurological: She is alert and oriented to person, place, and time.  Skin: Skin is warm and dry. Capillary refill takes less than 2 seconds. She is not diaphoretic.  Psychiatric: She has a normal mood and affect.  Nursing note and vitals reviewed.   Urgent Care Course     Procedures (including critical care time)  Labs Review Labs Reviewed  POCT URINALYSIS DIP (DEVICE) - Abnormal; Notable  for the following:       Result Value   Bilirubin Urine LARGE (*)    Ketones, ur 15 (*)    Hgb urine dipstick LARGE (*)    Protein, ur >=300 (*)    Urobilinogen, UA 4.0 (*)    Leukocytes, UA LARGE (*)    All other components within normal limits  URINE CULTURE  POCT PREGNANCY, URINE  URINE CYTOLOGY ANCILLARY ONLY    Imaging Review No results found.   Visual Acuity Review  Right Eye Distance:   Left Eye Distance:   Bilateral Distance:    Right Eye Near:   Left Eye Near:    Bilateral Near:         MDM   1. BV (bacterial vaginosis)   2. Acute pyelonephritis    To treat bacterial vaginosis, I have prescribed metronidazole. Take 1 tablet twice a day for 7 days. Do not drink any alcohol while taking this medicine as it can make you very ill.  I have also prescribed Keflex, take one tablet 4 times a day for 5 days. For pain I have prescribed Pyridium take one tablet 3 times a day for 2 days, this medication will stain your urine bright orange or red. For Nausea, I have prescribed Zofran, take 1 tablet under the tongue every 8 hours as needed. Your urine will be sent for culture, should anything grow out, or if you need additional therapy beyond what was prescribed tonight your be contacted in 3-5 business days. He also been screened for gonorrhea, chlamydia, bacteria known to cause bacterial vaginosis, and for yeast. Should your symptoms worsen, or fail to improve, follow up with her primary care provider, or return to clinic as needed.     Dorena BodoLawrence Tayli Buch, NP 12/22/16 2015

## 2016-12-23 LAB — URINE CYTOLOGY ANCILLARY ONLY
Chlamydia: NEGATIVE
Neisseria Gonorrhea: NEGATIVE
TRICH (WINDOWPATH): POSITIVE — AB

## 2016-12-25 LAB — URINE CULTURE

## 2016-12-25 LAB — URINE CYTOLOGY ANCILLARY ONLY
Bacterial vaginitis: POSITIVE — AB
CANDIDA VAGINITIS: NEGATIVE

## 2017-02-28 ENCOUNTER — Ambulatory Visit (HOSPITAL_COMMUNITY)
Admission: EM | Admit: 2017-02-28 | Discharge: 2017-02-28 | Disposition: A | Discharge status: Home / Self Care | Payer: Medicaid Other | Attending: Internal Medicine | Admitting: Internal Medicine

## 2017-02-28 ENCOUNTER — Encounter (HOSPITAL_COMMUNITY): Payer: Self-pay | Admitting: Emergency Medicine

## 2017-02-28 DIAGNOSIS — N760 Acute vaginitis: Secondary | ICD-10-CM | POA: Diagnosis not present

## 2017-02-28 MED ORDER — HYDROCORTISONE VALERATE 0.2 % EX OINT: 1.0000 "application " | TOPICAL_OINTMENT | Freq: Two times a day (BID) | CUTANEOUS | 0 refills | Status: DC

## 2017-02-28 MED ORDER — METRONIDAZOLE 500 MG PO TABS: 500.0000 mg | ORAL_TABLET | Freq: Two times a day (BID) | ORAL | 0 refills | Status: DC

## 2017-02-28 NOTE — ED Provider Notes (Signed)
[facility]    CSN: [id number] Arrival date & time: 02/28/17  1906     History   Chief Complaint Chief Complaint  Patient presents with  . Vaginal Discharge    HPI Suzanne Lamb is a 26 y.o. female. She has history of recurrent vaginitis, usually turns out to be bacterial vaginosis. Has good results with oral metronidazole, just tends to recur. Not sexually active, denies any chance of pregnancy.  Has had vaginal irritation and discharge now for the last day. Itching. No pelvic pain.    HPI  Past Medical History:  Diagnosis Date  . Anemia   . Bell's palsy 2014  . Hx of chlamydia infection   . Hx of gonorrhea     Patient Active Problem List   Diagnosis Date Noted  . Oligohydramnios 11/23/2015  . Spontaneous vaginal delivery 11/23/2015  . NSVD (normal spontaneous vaginal delivery) 06/18/2013  . Acute blood loss anemia 06/18/2013  . Pregnancy complicated by cervical shortening 06/18/2013    Past Surgical History:  Procedure Laterality Date  . NO PAST SURGERIES      OB History    Gravida Para Term Preterm AB Living   2 2 2     2    SAB TAB Ectopic Multiple Live Births         0 2       Home Medications    Prior to Admission medications   Medication Sig Start Date End Date Taking? Authorizing Provider  hydrocortisone valerate ointment (WESTCORT) 0.2 % Apply 1 application topically 2 (two) times daily. 02/28/17   Eustace MooreMurray, Lottie Sigman W, MD  metroNIDAZOLE (FLAGYL) 500 MG tablet Take 1 tablet (500 mg total) by mouth 2 (two) times daily. 02/28/17   Eustace MooreMurray, Charli Halle W, MD    Family History Family History  Problem Relation Age of Onset  . Diabetes Mother   . Hypertension Mother   . Kidney disease Mother   . Alcohol abuse Neg Hx   . Arthritis Neg Hx   . Asthma Neg Hx   . Birth defects Neg Hx   . Cancer Neg Hx   . COPD Neg Hx   . Depression Neg Hx   . Drug abuse Neg Hx   . Early death Neg Hx   . Hearing loss Neg Hx   . Heart disease Neg Hx   .  Hyperlipidemia Neg Hx   . Learning disabilities Neg Hx   . Mental illness Neg Hx   . Mental retardation Neg Hx   . Miscarriages / Stillbirths Neg Hx   . Stroke Neg Hx   . Vision loss Neg Hx     Social History Social History  Substance Use Topics  . Smoking status: Never Smoker  . Smokeless tobacco: Never Used  . Alcohol use No     Allergies   Patient has no known allergies.   Review of Systems Review of Systems  All other systems reviewed and are negative.    Physical Exam Triage Vital Signs ED Triage Vitals  Enc Vitals Group     BP 02/28/17 1925 121/77     Pulse Rate 02/28/17 1925 94     Resp 02/28/17 1925 18     Temp 02/28/17 1925 98.4 F (36.9 C)     Temp Source 02/28/17 1925 Oral     SpO2 02/28/17 1925 100 %     Weight --      Height --      Pain Score 02/28/17 1924  5     Pain Loc --    Updated Vital Signs BP 121/77 (BP Location: Right Arm)   Pulse 94   Temp 98.4 F (36.9 C) (Oral)   Resp 18   LMP 02/19/2017 (Exact Date)   SpO2 100%   Physical Exam  Constitutional: She is oriented to person, place, and time. No distress.  HENT:  Head: Atraumatic.  Eyes:  Conjugate gaze observed, no eye redness/discharge  Neck: Neck supple.  Cardiovascular: Normal rate.   Pulmonary/Chest: No respiratory distress.  Abdominal: She exhibits no distension.  Genitourinary:  Genitourinary Comments: Mild inflammation of labia majora and minora, with mild erythema/swelling/papules. No blisters/pustules, no erosions. Vaginal swab obtained.  Musculoskeletal: Normal range of motion.  Neurological: She is alert and oriented to person, place, and time.  Skin: Skin is warm and dry.  Nursing note and vitals reviewed.    UC Treatments / Results  Cervicalvag cytology pending  Procedures Procedures (including critical care time) None today  Final Clinical Impressions(s) / UC Diagnoses   Final diagnoses:  Acute vaginitis   Tests for yeast, bacterial vaginosis, and  trichomonas were done at the urgent care today.  The urgent care will contact you if any additional treatment is needed.  Prescriptions for metronidazole and for a steroid cream were sent to the pharmacy.    New Prescriptions Discharge Medication List as of 02/28/2017  8:24 PM    START taking these medications   Details  hydrocortisone valerate ointment (WESTCORT) 0.2 % Apply 1 application topically 2 (two) times daily., Starting Fri 02/28/2017, Normal    metroNIDAZOLE (FLAGYL) 500 MG tablet Take 1 tablet (500 mg total) by mouth 2 (two) times daily., Starting Fri 02/28/2017, Normal         Eustace Moore, MD 03/01/17 2207

## 2017-02-28 NOTE — Discharge Instructions (Addendum)
Tests for yeast, bacterial vaginosis, and trichomonas were done at the urgent care today.  The urgent care will contact you if any additional treatment is needed.  Prescriptions for metronidazole and for a steroid cream were sent to the pharmacy.

## 2017-02-28 NOTE — ED Triage Notes (Signed)
[facility] a [facility] a vaginal discharge x 1 day. The patient reported a hx of recurrent BV.

## 2017-03-03 ENCOUNTER — Telehealth (HOSPITAL_COMMUNITY): Payer: Self-pay | Admitting: Internal Medicine

## 2017-03-03 LAB — CERVICOVAGINAL ANCILLARY ONLY
BACTERIAL VAGINITIS: POSITIVE — AB
Candida vaginitis: POSITIVE — AB
TRICH (WINDOWPATH): NEGATIVE

## 2017-03-03 MED ORDER — FLUCONAZOLE 150 MG PO TABS: 150.0000 mg | ORAL_TABLET | Freq: Once | ORAL | 0 refills | Status: AC

## 2017-03-03 NOTE — Telephone Encounter (Signed)
Please let patient know that test for candida (yeast) was positive.  Rx fluconazole was sent to the pharmacy of record, [facility] [address] [facility] [address].   Test for gardnerella (bacterial vaginosis) was also positive; rx metronidazole was given at the urgent care visit.  Recheck for further evaluation if symptoms are not improving.  LM

## 2017-03-15 DIAGNOSIS — J36 Peritonsillar abscess: Secondary | ICD-10-CM | POA: Insufficient documentation

## 2017-03-15 DIAGNOSIS — Z79899 Other long term (current) drug therapy: Secondary | ICD-10-CM | POA: Insufficient documentation

## 2017-03-16 ENCOUNTER — Encounter (HOSPITAL_COMMUNITY): Payer: Self-pay | Admitting: Oncology

## 2017-03-16 ENCOUNTER — Emergency Department (HOSPITAL_COMMUNITY)
Admission: EM | Admit: 2017-03-16 | Discharge: 2017-03-16 | Disposition: A | Discharge status: Home / Self Care | Payer: Medicaid Other | Attending: Emergency Medicine | Admitting: Emergency Medicine

## 2017-03-16 DIAGNOSIS — J36 Peritonsillar abscess: Secondary | ICD-10-CM

## 2017-03-16 LAB — CBC WITH DIFFERENTIAL/PLATELET
BASOS ABS: 0 10*3/uL (ref 0.0–0.1)
BASOS PCT: 0 %
EOS ABS: 0 10*3/uL (ref 0.0–0.7)
Eosinophils Relative: 0 %
HEMATOCRIT: 32.1 % — AB (ref 36.0–46.0)
Hemoglobin: 11.1 g/dL — ABNORMAL LOW (ref 12.0–15.0)
LYMPHS ABS: 2.2 10*3/uL (ref 0.7–4.0)
Lymphocytes Relative: 7 %
MCH: 28.7 pg (ref 26.0–34.0)
MCHC: 34.6 g/dL (ref 30.0–36.0)
MCV: 82.9 fL (ref 78.0–100.0)
Monocytes Absolute: 3.1 10*3/uL — ABNORMAL HIGH (ref 0.1–1.0)
Monocytes Relative: 10 %
NEUTROS ABS: 26 10*3/uL — AB (ref 1.7–7.7)
Neutrophils Relative %: 83 %
Platelets: 283 10*3/uL (ref 150–400)
RBC: 3.87 MIL/uL (ref 3.87–5.11)
RDW: 13.9 % (ref 11.5–15.5)
WBC: 31.3 10*3/uL — ABNORMAL HIGH (ref 4.0–10.5)

## 2017-03-16 MED ORDER — SODIUM CHLORIDE 0.9 % IV BOLUS (SEPSIS)
1000.0000 mL | Freq: Once | INTRAVENOUS | Status: AC
  Administered 2017-03-16: 1000 mL via INTRAVENOUS

## 2017-03-16 MED ORDER — ACETAMINOPHEN 325 MG PO TABS: 650.0000 mg | ORAL_TABLET | Freq: Once | ORAL | Status: DC

## 2017-03-16 MED ORDER — FENTANYL CITRATE (PF) 100 MCG/2ML IJ SOLN
50.0000 ug | Freq: Once | INTRAMUSCULAR | Status: AC
  Administered 2017-03-16: 50 ug via INTRAVENOUS
  Filled 2017-03-16: qty 2

## 2017-03-16 MED ORDER — CLINDAMYCIN PHOSPHATE 600 MG/50ML IV SOLN
600.0000 mg | Freq: Once | INTRAVENOUS | Status: AC
  Administered 2017-03-16: 600 mg via INTRAVENOUS
  Filled 2017-03-16: qty 50

## 2017-03-16 MED ORDER — ACETAMINOPHEN 160 MG/5ML PO SOLN
650.0000 mg | Freq: Once | ORAL | Status: AC
  Administered 2017-03-16: 650 mg via ORAL
  Filled 2017-03-16: qty 20.3

## 2017-03-16 MED ORDER — AMOXICILLIN 250 MG/5ML PO SUSR: ORAL | 0 refills | Status: DC

## 2017-03-16 MED ORDER — AMOXICILLIN 250 MG/5ML PO SUSR
1000.0000 mg | Freq: Once | ORAL | Status: AC
  Administered 2017-03-16: 1000 mg via ORAL
  Filled 2017-03-16: qty 20

## 2017-03-16 MED ORDER — ONDANSETRON HCL 4 MG/2ML IJ SOLN
4.0000 mg | Freq: Once | INTRAMUSCULAR | Status: AC
  Administered 2017-03-16: 4 mg via INTRAVENOUS
  Filled 2017-03-16: qty 2

## 2017-03-16 MED ORDER — DEXAMETHASONE SODIUM PHOSPHATE 10 MG/ML IJ SOLN
10.0000 mg | Freq: Once | INTRAMUSCULAR | Status: AC
  Administered 2017-03-16: 10 mg via INTRAVENOUS
  Filled 2017-03-16: qty 1

## 2017-03-16 NOTE — ED Notes (Signed)
Ambulated pt in hall. Pt's gait steady on her feet.

## 2017-03-16 NOTE — ED Triage Notes (Signed)
Pt c/o sore throat since Friday.  Pt is unable to maintain her saliva or speak clearly.  Right side of throat is red w/ white area.  Pt rates pain 10/10.  Pt is tachycardic in triage.  Suspicious for peritonsillar abscess.

## 2017-03-16 NOTE — ED Notes (Signed)
Pt reported having pain and swelling along with difficulty swallowing since 03/14/17. Pt reports worse pain on right side of throat.

## 2017-03-16 NOTE — ED Provider Notes (Signed)
[facility] Provider Note   CSN: [id number] Arrival date & time: 03/15/17  2352   By signing my name below, I, Clarisse GougeXavier Herndon, attest that this documentation has been prepared under the direction and in the presence of Zadie RhineWickline, Kathline Banbury, MD. Electronically signed, Clarisse GougeXavier Herndon, ED Scribe. 03/16/17. 12:46 AM.   History   Chief Complaint Chief Complaint  Patient presents with  . Sore Throat   The history is provided by the patient and medical records. No language interpreter was used.  Sore Throat  This is a new problem. The current episode started yesterday. The problem occurs constantly. Pertinent negatives include no chest pain, no abdominal pain, no headaches and no shortness of breath. The symptoms are aggravated by swallowing. Nothing relieves the symptoms.    Suzanne Lamb is an otherwise healthy 26 y.o. female BIB EMS who presents to the Emergency Department with concern for gradually worsening, constant R sided throat pain onset last night.  Associated redness and swelling; 10/10 throbbing pain noted to the R side of the throat. Cough, drooling and difficulty swallowing also noted. No medication allergies noted. No fever, N/V noted. No other complaints at this time. LNMP 03/12/2017.   Past Medical History:  Diagnosis Date  . Anemia   . Bell's palsy 2014  . Hx of chlamydia infection   . Hx of gonorrhea     Patient Active Problem List   Diagnosis Date Noted  . Oligohydramnios 11/23/2015  . Spontaneous vaginal delivery 11/23/2015  . NSVD (normal spontaneous vaginal delivery) 06/18/2013  . Acute blood loss anemia 06/18/2013  . Pregnancy complicated by cervical shortening 06/18/2013    Past Surgical History:  Procedure Laterality Date  . NO PAST SURGERIES      OB History    Gravida Para Term Preterm AB Living   2 2 2     2    SAB TAB Ectopic Multiple Live Births         0 2       Home Medications    Prior to Admission medications   Medication Sig  Start Date End Date Taking? Authorizing Provider  hydrocortisone valerate ointment (WESTCORT) 0.2 % Apply 1 application topically 2 (two) times daily. 02/28/17   Eustace MooreMurray, Laura W, MD  metroNIDAZOLE (FLAGYL) 500 MG tablet Take 1 tablet (500 mg total) by mouth 2 (two) times daily. 02/28/17   Eustace MooreMurray, Laura W, MD    Family History Family History  Problem Relation Age of Onset  . Diabetes Mother   . Hypertension Mother   . Kidney disease Mother   . Alcohol abuse Neg Hx   . Arthritis Neg Hx   . Asthma Neg Hx   . Birth defects Neg Hx   . Cancer Neg Hx   . COPD Neg Hx   . Depression Neg Hx   . Drug abuse Neg Hx   . Early death Neg Hx   . Hearing loss Neg Hx   . Heart disease Neg Hx   . Hyperlipidemia Neg Hx   . Learning disabilities Neg Hx   . Mental illness Neg Hx   . Mental retardation Neg Hx   . Miscarriages / Stillbirths Neg Hx   . Stroke Neg Hx   . Vision loss Neg Hx     Social History Social History  Substance Use Topics  . Smoking status: Never Smoker  . Smokeless tobacco: Never Used  . Alcohol use No     Allergies   Patient has no known allergies.  Review of Systems Review of Systems  HENT: Positive for drooling, sore throat, trouble swallowing and voice change.   Respiratory: Positive for cough. Negative for shortness of breath.   Cardiovascular: Negative for chest pain.  Gastrointestinal: Negative for abdominal pain.  Neurological: Negative for headaches.  All other systems reviewed and are negative.    Physical Exam Updated Vital Signs BP 107/74 (BP Location: Left Arm)   Pulse (!) 135   Temp 99.4 F (37.4 C) (Oral)   Resp (!) 22   Ht 5\' 11"  (1.803 m)   LMP 03/12/2017 (Exact Date)   SpO2 99%   Physical Exam CONSTITUTIONAL: Well developed/well nourished HEAD: Normocephalic/atraumatic EYES: EOMI/PERRL ENMT: Mucous membranes moist, R peritonsillar abscess noted, no stridor. No drooling. NECK: supple no meningeal signs. R anterior cervical lymph  adenopathy. SPINE/BACK:entire spine nontender CV: S1/S2 noted, no murmurs/rubs/gallops noted LUNGS: Lungs are clear to auscultation bilaterally, no apparent distress ABDOMEN: soft, nontender, no rebound or guarding, bowel sounds noted throughout abdomen GU:no cva tenderness NEURO: Pt is awake/alert/appropriate, moves all extremitiesx4.  EXTREMITIES: pulses normal/equal, full ROM SKIN: warm, color normal PSYCH: no abnormalities of mood noted, alert and oriented to situation   ED Treatments / Results  DIAGNOSTIC STUDIES: Oxygen Saturation is 99% on RA, NL by my interpretation.    COORDINATION OF CARE: 12:30 AM-Discussed next steps with pt. Pt verbalized understanding and is agreeable with the plan. Will order medications and fluids. F/U instructions advised.   Labs (all labs ordered are listed, but only abnormal results are displayed) Labs Reviewed  CBC WITH DIFFERENTIAL/PLATELET - Abnormal; Notable for the following:       Result Value   WBC 31.3 (*)    Hemoglobin 11.1 (*)    HCT 32.1 (*)    Neutro Abs 26.0 (*)    Monocytes Absolute 3.1 (*)    All other components within normal limits    EKG  EKG Interpretation None       Radiology No results found.  Procedures Procedures (including critical care time)  Medications Ordered in ED Medications  clindamycin (CLEOCIN) IVPB 600 mg (0 mg Intravenous Stopped 03/16/17 0128)  fentaNYL (SUBLIMAZE) injection 50 mcg (50 mcg Intravenous Given 03/16/17 0052)  ondansetron (ZOFRAN) injection 4 mg (4 mg Intravenous Given 03/16/17 0052)  sodium chloride 0.9 % bolus 1,000 mL (0 mLs Intravenous Stopped 03/16/17 0150)  acetaminophen (TYLENOL) solution 650 mg (650 mg Oral Given 03/16/17 0049)  dexamethasone (DECADRON) injection 10 mg (10 mg Intravenous Given 03/16/17 0150)  amoxicillin (AMOXIL) 250 MG/5ML suspension 1,000 mg (1,000 mg Oral Given 03/16/17 0151)     Initial Impression / Assessment and Plan / ED Course  I have reviewed the  triage vital signs and the nursing notes.  Pertinent labs  results that were available during my care of the patient were reviewed by me and considered in my medical decision making (see chart for details).     1:16 AM Pt with obvious right PTA No distress Neck supple D/w dr Lazarus Salines Recommends amoxicillin 1gm TID for 3 days, then amox 500mg  TID He can see in office on 5/21 He recommends decadron No imaging requested per dr Lazarus Salines 1:44 AM Pt stable, vitals improved 4:27 AM Pt ambulatory Vitals improved BP 100/70 (BP Location: Left Arm)   Pulse 89   Temp 98.9 F (37.2 C) (Oral)   Resp 18   Ht 5\' 11"  (1.803 m)   Wt 170 lb (77.1 kg)   LMP 03/12/2017 (Exact Date)   SpO2 97%  BMI 23.71 kg/m  Taking PO No drooling Sleeping without issue Will d/c home  Discussed strict ER precautions with patient including neck stiffness or unable to take PO All pertinent questions answered prior to discharge Patient appropriate and safe for discharge at this time Advised to f/u with ENT Dr Lazarus Salines on 5/21, call office first thing Monday morning Pt agreeable  Final Clinical Impressions(s) / ED Diagnoses   Final diagnoses:  Peritonsillar abscess    New Prescriptions New Prescriptions   AMOXICILLIN (AMOXIL) 250 MG/5ML SUSPENSION    Take 20ml (1,000mg ) three times a day for 3 days, then take 20ml (1,000) twice a day for 4 days  I personally performed the services described in this documentation, which was scribed in my presence. The recorded information has been reviewed and is accurate.      Zadie Rhine, MD 03/16/17 [phone]

## 2018-01-21 ENCOUNTER — Encounter (HOSPITAL_COMMUNITY): Payer: Self-pay | Admitting: Emergency Medicine

## 2018-01-21 ENCOUNTER — Ambulatory Visit (HOSPITAL_COMMUNITY)
Admission: EM | Admit: 2018-01-21 | Discharge: 2018-01-21 | Disposition: A | Discharge status: Home / Self Care | Payer: Self-pay | Attending: Internal Medicine | Admitting: Internal Medicine

## 2018-01-21 ENCOUNTER — Other Ambulatory Visit: Payer: Self-pay

## 2018-01-21 DIAGNOSIS — Z975 Presence of (intrauterine) contraceptive device: Secondary | ICD-10-CM

## 2018-01-21 DIAGNOSIS — N898 Other specified noninflammatory disorders of vagina: Secondary | ICD-10-CM | POA: Insufficient documentation

## 2018-01-21 LAB — POCT URINALYSIS DIP (DEVICE)
BILIRUBIN URINE: NEGATIVE
GLUCOSE, UA: NEGATIVE mg/dL
Ketones, ur: NEGATIVE mg/dL
NITRITE: NEGATIVE
Protein, ur: NEGATIVE mg/dL
Specific Gravity, Urine: 1.02 (ref 1.005–1.030)
UROBILINOGEN UA: 0.2 mg/dL (ref 0.0–1.0)
pH: 6.5 (ref 5.0–8.0)

## 2018-01-21 LAB — POCT PREGNANCY, URINE: PREG TEST UR: NEGATIVE

## 2018-01-21 MED ORDER — METRONIDAZOLE 500 MG PO TABS: 500.0000 mg | ORAL_TABLET | Freq: Two times a day (BID) | ORAL | 0 refills | Status: DC

## 2018-01-21 NOTE — ED Provider Notes (Signed)
  MRN: [id number] DOB: Apr 27, 1991  Subjective:   Suzanne Lamb is a 27 y.o. female presenting for 2 week history of malodorous discharge. Has history of BV, last episode was January 2019, has used salt water baths, boric acid but continues to have issues. Has worked with her gynecologist and PCP on this too. Patient uses Nexplanon, reports that she has had issues with BV ever since having this implanted. She has No Known Allergies. Denies dysuria, hematuria, urinary frequency, genital rashes, vaginal bleeding, itching, n/v, abdominal pain, fever. Denies smoking cigarettes. Tries to hydrate well with 5-8 bottles of water.    Past Medical History:  Diagnosis Date  . Anemia   . Bell's palsy 2014  . Hx of chlamydia infection   . Hx of gonorrhea      Past Surgical History:  Procedure Laterality Date  . NO PAST SURGERIES      Objective:   Vitals: BP 127/78 (BP Location: Right Arm)   Pulse 84   Temp 98.7 F (37.1 C) (Oral)   Resp 18   SpO2 100%   Physical Exam  Constitutional: She is oriented to person, place, and time. She appears well-developed and well-nourished.  Cardiovascular: Normal rate.  Pulmonary/Chest: Effort normal.  Neurological: She is alert and oriented to person, place, and time.  Psychiatric: She has a normal mood and affect.   Results for orders placed or performed during the hospital encounter of 01/21/18 (from the past 24 hour(s))  POCT urinalysis dip (device)     Status: Abnormal   Collection Time: 01/21/18  7:37 PM  Result Value Ref Range   Glucose, UA NEGATIVE NEGATIVE mg/dL   Bilirubin Urine NEGATIVE NEGATIVE   Ketones, ur NEGATIVE NEGATIVE mg/dL   Specific Gravity, Urine 1.020 1.005 - 1.030   Hgb urine dipstick TRACE (A) NEGATIVE   pH 6.5 5.0 - 8.0   Protein, ur NEGATIVE NEGATIVE mg/dL   Urobilinogen, UA 0.2 0.0 - 1.0 mg/dL   Nitrite NEGATIVE NEGATIVE   Leukocytes, UA SMALL (A) NEGATIVE  Pregnancy, urine POC     Status: None   Collection Time:  01/21/18  7:51 PM  Result Value Ref Range   Preg Test, Ur NEGATIVE NEGATIVE   Assessment and Plan :   Vaginal discharge  Nexplanon in place  Labs pending, will start Flagyl. However, I emphasized to patient that vaginitis is a potential side effect of Nexplanon and that she should discuss this with her gynecologist and/or PCP instead of continuing to obtain Flagyl for BV. Patient was in agreement.

## 2018-01-21 NOTE — ED Triage Notes (Signed)
Patient reports vaginal discharge for 2 weeks, patient reports odor to discharge.

## 2018-01-22 LAB — URINE CYTOLOGY ANCILLARY ONLY
CHLAMYDIA, DNA PROBE: NEGATIVE
NEISSERIA GONORRHEA: NEGATIVE
Trichomonas: NEGATIVE

## 2018-01-23 LAB — URINE CULTURE

## 2018-01-24 LAB — URINE CYTOLOGY ANCILLARY ONLY: CANDIDA VAGINITIS: NEGATIVE

## 2018-01-27 ENCOUNTER — Telehealth (HOSPITAL_COMMUNITY): Payer: Self-pay

## 2018-01-27 NOTE — Telephone Encounter (Signed)
Attempted to contact patient regarding recent visit and test results. No answer at this time. Message left encouraging patient to call back.

## 2018-07-08 ENCOUNTER — Other Ambulatory Visit: Payer: Self-pay

## 2018-07-08 ENCOUNTER — Emergency Department (HOSPITAL_COMMUNITY): Admission: EM | Admit: 2018-07-08 | Discharge: 2018-07-09 | Discharge status: Against Medical Advice | Payer: Self-pay

## 2019-04-25 ENCOUNTER — Ambulatory Visit (HOSPITAL_COMMUNITY)
Admission: EM | Admit: 2019-04-25 | Discharge: 2019-04-25 | Disposition: A | Discharge status: Home / Self Care | Payer: Medicaid Other | Attending: Emergency Medicine | Admitting: Emergency Medicine

## 2019-04-25 ENCOUNTER — Other Ambulatory Visit: Payer: Self-pay

## 2019-04-25 DIAGNOSIS — R1084 Generalized abdominal pain: Secondary | ICD-10-CM

## 2019-04-25 DIAGNOSIS — K644 Residual hemorrhoidal skin tags: Secondary | ICD-10-CM | POA: Diagnosis not present

## 2019-04-25 DIAGNOSIS — K5900 Constipation, unspecified: Secondary | ICD-10-CM | POA: Diagnosis not present

## 2019-04-25 MED ORDER — DOCUSATE SODIUM 100 MG PO CAPS: 100.0000 mg | ORAL_CAPSULE | Freq: Two times a day (BID) | ORAL | 0 refills | Status: DC

## 2019-04-25 MED ORDER — POLYETHYLENE GLYCOL 3350 17 GM/SCOOP PO POWD: 17.0000 g | Freq: Every day | ORAL | 0 refills | Status: DC

## 2019-04-25 MED ORDER — OMEPRAZOLE 20 MG PO CPDR: 20.0000 mg | DELAYED_RELEASE_CAPSULE | Freq: Every day | ORAL | 0 refills | Status: DC

## 2019-04-25 MED ORDER — HYDROCORTISONE ACETATE 1 % EX OINT: 1.0000 g | TOPICAL_OINTMENT | Freq: Two times a day (BID) | CUTANEOUS | 0 refills | Status: DC

## 2019-04-25 MED ORDER — ALUM & MAG HYDROXIDE-SIMETH 400-400-40 MG/5ML PO SUSP: 10.0000 mL | Freq: Four times a day (QID) | ORAL | 0 refills | Status: DC | PRN

## 2019-04-25 NOTE — ED Triage Notes (Signed)
Per pt she ahs been having cramps and gas since she gave birth to hger son in 2017. She stated she has hemorrhoids and she seems to feel like they are casing her not to be able to move gas or feces. Pt says she just feels like she has to throw up bc she feels so full and bloated.

## 2019-04-25 NOTE — Discharge Instructions (Signed)
Reflux/Indigestion Begin omeprazole daily x 2 weeks Maalox as needed every 6 hours for reflux/gas  Constipation  For cleanout- use attached  Please use Miralax for moderate to severe constipation. Take this once a day for the next 2-3 days. Please also start docusate stool softener, twice a day for at least 1 week. If stools become loose, cut down to once a day for another week. If stools remain loose, cut back to 1 pill every other day for a third week. You can stop docusate thereafter and resume as needed for constipation.  Could try magnesium citrate over the counter  To help reduce constipation and promote bowel health: 1. Drink at least 64 ounces of water each day 2. Eat plenty of fiber (fruits, vegetables, whole grains, legumes) 3. Be physically active or exercise including walking, jogging, swimming, yoga, etc. 4. For active constipation use a stool softener (docusate) or an osmotic laxative (like Miralax) each day, or as needed.

## 2019-04-26 NOTE — ED Provider Notes (Signed)
[facility]    CSN: [id number] Arrival date & time: 04/25/19  1800      History   Chief Complaint Chief Complaint  Patient presents with  . Abdominal Pain    BLOATING  . Constipation    HPI Suzanne Lamb is a 28 y.o. female no significant past medical history presenting today for evaluation of abdominal discomfort.  Patient states that over the past few years she has had off-and-on discomfort with associated constipation gas and reflux.  She states that all of this started after she gave birth to her son in 2017.  She states that she has infrequent bowel movements and when she does they are small pieces and hard.  She has to strain a lot.  Often feels as if she has to vomit when she eats due to the discomfort.  She also does endorse some indigestion and burning sensation that will occasionally come into her chest.  She has tried over-the-counter medicines without relief, but is unable to state which one she has tried.  She has never seen a gastroenterologist.  She is also concerned about hemorrhoids and notes that she has a swollen area near her rectum.  Pain only with bowel movements.  Denies any bleeding.  Will flareup at times, but currently not painful to sit.  Denies any fevers.  Denies chills or body aches.  Denies previous surgeries on abdomen.  HPI  Past Medical History:  Diagnosis Date  . Anemia   . Bell's palsy 2014  . Hx of chlamydia infection   . Hx of gonorrhea     Patient Active Problem List   Diagnosis Date Noted  . Oligohydramnios 11/23/2015  . Spontaneous vaginal delivery 11/23/2015  . NSVD (normal spontaneous vaginal delivery) 06/18/2013  . Acute blood loss anemia 06/18/2013  . Pregnancy complicated by cervical shortening 06/18/2013    Past Surgical History:  Procedure Laterality Date  . NO PAST SURGERIES      OB History    Gravida  2   Para  2   Term  2   Preterm      AB      Living  2     SAB      TAB      Ectopic      Multiple  0   Live Births  2            Home Medications    Prior to Admission medications   Medication Sig Start Date End Date Taking? Authorizing Provider  alum & mag hydroxide-simeth (MAALOX PLUS) 400-400-40 MG/5ML suspension Take 10 mLs by mouth every 6 (six) hours as needed for indigestion. 04/25/19   ,  C, PA-C  docusate sodium (COLACE) 100 MG capsule Take 1 capsule (100 mg total) by mouth every 12 (twelve) hours. 04/25/19   ,  C, PA-C  Hydrocortisone Acetate 1 % OINT Apply 1 g topically 2 (two) times a day. 04/25/19   ,  C, PA-C  omeprazole (PRILOSEC) 20 MG capsule Take 1 capsule (20 mg total) by mouth daily for 14 days. 04/25/19 05/09/19  ,  C, PA-C  polyethylene glycol powder (GLYCOLAX/MIRALAX) 17 GM/SCOOP powder Take 17 g by mouth daily. 04/25/19   , Junius Creamer C, PA-C    Family History Family History  Problem Relation Age of Onset  . Diabetes Mother   . Hypertension Mother   . Kidney disease Mother   . Alcohol abuse Neg Hx   . Arthritis Neg Hx   .  Asthma Neg Hx   . Birth defects Neg Hx   . Cancer Neg Hx   . COPD Neg Hx   . Depression Neg Hx   . Drug abuse Neg Hx   . Early death Neg Hx   . Hearing loss Neg Hx   . Heart disease Neg Hx   . Hyperlipidemia Neg Hx   . Learning disabilities Neg Hx   . Mental illness Neg Hx   . Mental retardation Neg Hx   . Miscarriages / Stillbirths Neg Hx   . Stroke Neg Hx   . Vision loss Neg Hx     Social History Social History   Tobacco Use  . Smoking status: Never Smoker  . Smokeless tobacco: Never Used  Substance Use Topics  . Alcohol use: No  . Drug use: No    Types: Marijuana    Comment: stopped with pos UPT-did not start back     Allergies   Patient has no known allergies.   Review of Systems Review of Systems  Constitutional: Negative for activity change, appetite change, chills, fatigue and fever.  HENT: Negative for congestion, ear pain, rhinorrhea,  sinus pressure, sore throat and trouble swallowing.   Eyes: Negative for discharge and redness.  Respiratory: Negative for cough, chest tightness and shortness of breath.   Cardiovascular: Negative for chest pain.  Gastrointestinal: Positive for abdominal distention, abdominal pain, constipation and nausea. Negative for diarrhea and vomiting.  Genitourinary: Negative for dysuria, flank pain, genital sores, hematuria, menstrual problem, vaginal bleeding, vaginal discharge and vaginal pain.  Musculoskeletal: Negative for back pain and myalgias.  Skin: Negative for rash.  Neurological: Negative for dizziness, light-headedness and headaches.     Physical Exam Triage Vital Signs ED Triage Vitals  Enc Vitals Group     BP 04/25/19 1837 124/61     Pulse Rate 04/25/19 1837 76     Resp 04/25/19 1837 16     Temp 04/25/19 1837 98.6 F (37 C)     Temp Source 04/25/19 1837 Oral     SpO2 04/25/19 1837 100 %     Weight --      Height --      Head Circumference --      Peak Flow --      Pain Score 04/25/19 1919 5     Pain Loc --      Pain Edu? --      Excl. in [place]? --    No data found.  Updated Vital Signs BP 124/61 (BP Location: Right Arm)   Pulse 76   Temp 98.6 F (37 C) (Oral)   Resp 16   SpO2 100%   Visual Acuity Right Eye Distance:   Left Eye Distance:   Bilateral Distance:    Right Eye Near:   Left Eye Near:    Bilateral Near:     Physical Exam Vitals signs and nursing note reviewed.  Constitutional:      General: She is not in acute distress.    Appearance: She is well-developed.     Comments: No acute distress, sitting comfortably on exam table  HENT:     Head: Normocephalic and atraumatic.  Eyes:     Conjunctiva/sclera: Conjunctivae normal.  Neck:     Musculoskeletal: Neck supple.  Cardiovascular:     Rate and Rhythm: Normal rate and regular rhythm.     Heart sounds: No murmur.  Pulmonary:     Effort: Pulmonary effort is normal. No respiratory distress.  Breath sounds: Normal breath sounds.     Comments: Breathing comfortably at rest, CTABL, no wheezing, rales or other adventitious sounds auscultated Abdominal:     Palpations: Abdomen is soft.     Tenderness: There is abdominal tenderness.     Comments: Soft, slightly distended, dull to percussion, generalized tenderness throughout entire abdomen, no focal tenderness, negative rebound, negative Rovsing, negative Murphy's, negative McBurney's  Skin:    General: Skin is warm and dry.  Neurological:     Mental Status: She is alert.      UC Treatments / Results  Labs (all labs ordered are listed, but only abnormal results are displayed) Labs Reviewed - No data to display  EKG None  Radiology No results found.  Procedures Procedures (including critical care time)  Medications Ordered in UC Medications - No data to display  Initial Impression / Assessment and Plan / UC Course  I have reviewed the triage vital signs and the nursing notes.  Pertinent labs & imaging results that were available during my care of the patient were reviewed by me and considered in my medical decision making (see chart for details).     Patient with symptoms suggestive of constipation, hemorrhoids and possible underlying reflux.  Will initiate on omeprazole x2 weeks trial.  May supplement with Maalox as needed to help with further indigestion and gas.  Hemorrhoids likely secondary to constipation, Anusol HC twice daily as needed for these.  For constipation recommend trial of a cleanout followed by daily regimen as this has been a chronic issue.  Discussed lifestyle modifications.  Advised to continue to monitor, follow-up if symptoms changing or worsening.  May consider following up with gastroenterology as well.Discussed strict return precautions. Patient verbalized understanding and is agreeable with plan.  Final Clinical Impressions(s) / UC Diagnoses   Final diagnoses:  Generalized abdominal pain   Constipation, unspecified constipation type  External hemorrhoids     Discharge Instructions     Reflux/Indigestion Begin omeprazole daily x 2 weeks Maalox as needed every 6 hours for reflux/gas  Constipation  For cleanout- use attached  Please use Miralax for moderate to severe constipation. Take this once a day for the next 2-3 days. Please also start docusate stool softener, twice a day for at least 1 week. If stools become loose, cut down to once a day for another week. If stools remain loose, cut back to 1 pill every other day for a third week. You can stop docusate thereafter and resume as needed for constipation.  Could try magnesium citrate over the counter  To help reduce constipation and promote bowel health: 1. Drink at least 64 ounces of water each day 2. Eat plenty of fiber (fruits, vegetables, whole grains, legumes) 3. Be physically active or exercise including walking, jogging, swimming, yoga, etc. 4. For active constipation use a stool softener (docusate) or an osmotic laxative (like Miralax) each day, or as needed.    ED Prescriptions    Medication Sig Dispense Auth. Provider   omeprazole (PRILOSEC) 20 MG capsule  (Status: Discontinued) Take 1 capsule (20 mg total) by mouth daily for 14 days. 14 capsule ,  C, PA-C   alum & mag hydroxide-simeth (MAALOX PLUS) 400-400-40 MG/5ML suspension  (Status: Discontinued) Take 10 mLs by mouth every 6 (six) hours as needed for indigestion. 355 mL ,  C, PA-C   polyethylene glycol powder (GLYCOLAX/MIRALAX) 17 GM/SCOOP powder  (Status: Discontinued) Take 17 g by mouth daily. 255 g , [place] C, [place]C  docusate sodium (COLACE) 100 MG capsule  (Status: Discontinued) Take 1 capsule (100 mg total) by mouth every 12 (twelve) hours. 60 capsule ,  C, PA-C   Hydrocortisone Acetate 1 % OINT  (Status: Discontinued) Apply 1 g topically 2 (two) times a day. 28.4 g ,  C, PA-C    polyethylene glycol powder (GLYCOLAX/MIRALAX) 17 GM/SCOOP powder Take 17 g by mouth daily. 255 g ,  C, PA-C   alum & mag hydroxide-simeth (MAALOX PLUS) 400-400-40 MG/5ML suspension Take 10 mLs by mouth every 6 (six) hours as needed for indigestion. 355 mL ,  C, PA-C   docusate sodium (COLACE) 100 MG capsule Take 1 capsule (100 mg total) by mouth every 12 (twelve) hours. 60 capsule ,  C, PA-C   Hydrocortisone Acetate 1 % OINT Apply 1 g topically 2 (two) times a day. 28.4 g ,  C, PA-C   omeprazole (PRILOSEC) 20 MG capsule Take 1 capsule (20 mg total) by mouth daily for 14 days. 14 capsule ,  C, PA-C     Controlled Substance Prescriptions [place] Controlled Substance Registry consulted? Not Applicable   Lew Dawes,  C, [place]C 04/26/19 [phone]

## 2019-06-09 ENCOUNTER — Ambulatory Visit: Payer: Medicaid Other | Admitting: Family Medicine

## 2019-06-10 ENCOUNTER — Ambulatory Visit: Payer: Medicaid Other | Admitting: Family Medicine

## 2019-06-16 ENCOUNTER — Other Ambulatory Visit: Payer: Self-pay

## 2019-06-16 ENCOUNTER — Ambulatory Visit (INDEPENDENT_AMBULATORY_CARE_PROVIDER_SITE_OTHER): Payer: Medicaid Other | Admitting: Family Medicine

## 2019-06-16 ENCOUNTER — Encounter: Payer: Self-pay | Admitting: Family Medicine

## 2019-06-16 VITALS — BP 107/72 | HR 93 | Temp 98.2°F | Resp 16 | Ht 70.0 in | Wt 146.0 lb

## 2019-06-16 DIAGNOSIS — F329 Major depressive disorder, single episode, unspecified: Secondary | ICD-10-CM | POA: Diagnosis not present

## 2019-06-16 DIAGNOSIS — K649 Unspecified hemorrhoids: Secondary | ICD-10-CM

## 2019-06-16 DIAGNOSIS — R634 Abnormal weight loss: Secondary | ICD-10-CM

## 2019-06-16 DIAGNOSIS — Z3046 Encounter for surveillance of implantable subdermal contraceptive: Secondary | ICD-10-CM

## 2019-06-16 DIAGNOSIS — R4589 Other symptoms and signs involving emotional state: Secondary | ICD-10-CM

## 2019-06-16 DIAGNOSIS — D649 Anemia, unspecified: Secondary | ICD-10-CM | POA: Diagnosis not present

## 2019-06-16 NOTE — Patient Instructions (Signed)
Health Maintenance, Female Adopting a healthy lifestyle and getting preventive care are important in promoting health and wellness. Ask your health care provider about:  The right schedule for you to have regular tests and exams.  Things you can do on your own to prevent diseases and keep yourself healthy. What should I know about diet, weight, and exercise? Eat a healthy diet   Eat a diet that includes plenty of vegetables, fruits, low-fat dairy products, and lean protein.  Do not eat a lot of foods that are high in solid fats, added sugars, or sodium. Maintain a healthy weight Body mass index (BMI) is used to identify weight problems. It estimates body fat based on height and weight. Your health care provider can help determine your BMI and help you achieve or maintain a healthy weight. Get regular exercise Get regular exercise. This is one of the most important things you can do for your health. Most adults should:  Exercise for at least 150 minutes each week. The exercise should increase your heart rate and make you sweat (moderate-intensity exercise).  Do strengthening exercises at least twice a week. This is in addition to the moderate-intensity exercise.  Spend less time sitting. Even light physical activity can be beneficial. Watch cholesterol and blood lipids Have your blood tested for lipids and cholesterol at 26 years of age, then have this test every 5 years. Have your cholesterol levels checked more often if:  Your lipid or cholesterol levels are high.  You are older than 26 years of age.  You are at high risk for heart disease. What should I know about cancer screening? Depending on your health history and family history, you may need to have cancer screening at various ages. This may include screening for:  Breast cancer.  Cervical cancer.  Colorectal cancer.  Skin cancer.  Lung cancer. What should I know about heart disease, diabetes, and high blood  pressure? Blood pressure and heart disease  High blood pressure causes heart disease and increases the risk of stroke. This is more likely to develop in people who have high blood pressure readings, are of African descent, or are overweight.  Have your blood pressure checked: ? Every 3-5 years if you are 18-39 years of age. ? Every year if you are 40 years old or older. Diabetes Have regular diabetes screenings. This checks your fasting blood sugar level. Have the screening done:  Once every three years after age 40 if you are at a normal weight and have a low risk for diabetes.  More often and at a younger age if you are overweight or have a high risk for diabetes. What should I know about preventing infection? Hepatitis B If you have a higher risk for hepatitis B, you should be screened for this virus. Talk with your health care provider to find out if you are at risk for hepatitis B infection. Hepatitis C Testing is recommended for:  Everyone born from 1945 through 1965.  Anyone with known risk factors for hepatitis C. Sexually transmitted infections (STIs)  Get screened for STIs, including gonorrhea and chlamydia, if: ? You are sexually active and are younger than 26 years of age. ? You are older than 26 years of age and your health care provider tells you that you are at risk for this type of infection. ? Your sexual activity has changed since you were last screened, and you are at increased risk for chlamydia or gonorrhea. Ask your health care provider if   you are at risk.  Ask your health care provider about whether you are at high risk for HIV. Your health care provider may recommend a prescription medicine to help prevent HIV infection. If you choose to take medicine to prevent HIV, you should first get tested for HIV. You should then be tested every 3 months for as long as you are taking the medicine. Pregnancy  If you are about to stop having your period (premenopausal) and  you may become pregnant, seek counseling before you get pregnant.  Take 400 to 800 micrograms (mcg) of folic acid every day if you become pregnant.  Ask for birth control (contraception) if you want to prevent pregnancy. Osteoporosis and menopause Osteoporosis is a disease in which the bones lose minerals and strength with aging. This can result in bone fractures. If you are 65 years old or older, or if you are at risk for osteoporosis and fractures, ask your health care provider if you should:  Be screened for bone loss.  Take a calcium or vitamin D supplement to lower your risk of fractures.  Be given hormone replacement therapy (HRT) to treat symptoms of menopause. Follow these instructions at home: Lifestyle  Do not use any products that contain nicotine or tobacco, such as cigarettes, e-cigarettes, and chewing tobacco. If you need help quitting, ask your health care provider.  Do not use street drugs.  Do not share needles.  Ask your health care provider for help if you need support or information about quitting drugs. Alcohol use  Do not drink alcohol if: ? Your health care provider tells you not to drink. ? You are pregnant, may be pregnant, or are planning to become pregnant.  If you drink alcohol: ? Limit how much you use to 0-1 drink a day. ? Limit intake if you are breastfeeding.  Be aware of how much alcohol is in your drink. In the [place] one drink equals one 12 oz bottle of beer (355 mL), one 5 oz glass of wine (148 mL), or one 1 oz glass of hard liquor (44 mL). General instructions  Schedule regular health, dental, and eye exams.  Stay current with your vaccines.  Tell your health care provider if: ? You often feel depressed. ? You have ever been abused or do not feel safe at home. Summary  Adopting a healthy lifestyle and getting preventive care are important in promoting health and wellness.  Follow your health care provider's instructions about healthy  diet, exercising, and getting tested or screened for diseases.  Follow your health care provider's instructions on monitoring your cholesterol and blood pressure. This information is not intended to replace advice given to you by your health care provider. Make sure you discuss any questions you have with your health care provider. Document Released: 04/29/2011 Document Revised: 10/07/2018 Document Reviewed: 10/07/2018 Elsevier Patient Education  2020 Elsevier Inc.  

## 2019-06-16 NOTE — Progress Notes (Signed)
[facility]  New Patient Encounter Provider: Mike Gipndre Venesha Petraitis, FNP    ZOX:[id number]  [id number]:[id number]  DOB - 08-27-1991  SUBJECTIVE:   Ronnie DerbyKeisha Lamb, is a 28 y.o. female who presents to establish care with this clinic.   Current problems/concerns:   Patient reports that she is concerned about weight loss. She reports that she is unable to eat due to nausea and vomiting. She was seen in the ED on 04/25/2019 due to this. Noted to have constipation and instructed to use miralax and anusol for possible hemorrhoids. Would like to have referral to GI for further evaluation.  Patient states that she has noticed a decrease in mood since having the nexplanon placed 2 years ago.  She would like to discuss removal of the implant. She states that she does not want to be on any form of birth control at this time. She states that she has been on several forms with severe side effects.  She also has a history of anemia. Not currently on iron due to constipation. Patient reports drinking 3-4 bottles of water a day. She reports having an unhealthy diet.   No Known Allergies Past Medical History:  Diagnosis Date  . Anemia   . Bell's palsy 2014  . Hx of chlamydia infection   . Hx of gonorrhea    Current Outpatient Medications on File Prior to Visit  Medication Sig Dispense Refill  . etonogestrel (NEXPLANON) 68 MG IMPL implant Nexplanon 68 mg subdermal implant  Inject 1 implant by subcutaneous route.     No current facility-administered medications on file prior to visit.    Family History  Problem Relation Age of Onset  . Diabetes Mother   . Hypertension Mother   . Kidney disease Mother   . Alcohol abuse Neg Hx   . Arthritis Neg Hx   . Asthma Neg Hx   . Birth defects Neg Hx   . Cancer Neg Hx   . COPD Neg Hx   . Depression Neg Hx   . Drug abuse Neg Hx   . Early death Neg Hx   . Hearing loss Neg Hx   . Heart disease Neg Hx   . Hyperlipidemia  Neg Hx   . Learning disabilities Neg Hx   . Mental illness Neg Hx   . Mental retardation Neg Hx   . Miscarriages / Stillbirths Neg Hx   . Stroke Neg Hx   . Vision loss Neg Hx    Social History   Socioeconomic History  . Marital status: Single    Spouse name: Not on file  . Number of children: Not on file  . Years of education: Not on file  . Highest education level: Not on file  Occupational History  . Not on file  Social Needs  . Financial resource strain: Not on file  . Food insecurity    Worry: Not on file    Inability: Not on file  . Transportation needs    Medical: Not on file    Non-medical: Not on file  Tobacco Use  . Smoking status: Never Smoker  . Smokeless tobacco: Never Used  Substance and Sexual Activity  . Alcohol use: No  . Drug use: Not Currently    Types: Marijuana    Comment: stopped with pos UPT-did not start back  . Sexual activity: Yes    Partners: Male    Birth control/protection: Implant  Lifestyle  . Physical activity  Days per week: Not on file    Minutes per session: Not on file  . Stress: Not on file  Relationships  . Social Herbalist on phone: Not on file    Gets together: Not on file    Attends religious service: Not on file    Active member of club or organization: Not on file    Attends meetings of clubs or organizations: Not on file    Relationship status: Not on file  . Intimate partner violence    Fear of current or ex partner: Not on file    Emotionally abused: Not on file    Physically abused: Not on file    Forced sexual activity: Not on file  Other Topics Concern  . Not on file  Social History Narrative  . Not on file    Review of Systems  Constitutional: Negative.   HENT: Negative.   Eyes: Negative.   Respiratory: Negative.   Cardiovascular: Negative.   Gastrointestinal: Negative.   Genitourinary: Negative.   Musculoskeletal: Negative.   Skin: Negative.   Neurological: Negative.    Psychiatric/Behavioral: Positive for depression. Negative for suicidal ideas.     OBJECTIVE:    BP 107/72 (BP Location: Left Arm, Patient Position: Sitting, Cuff Size: Normal)   Pulse 93   Temp 98.2 F (36.8 C) (Oral)   Resp 16   Ht 5\' 10"  (1.778 m)   Wt 146 lb (66.2 kg)   SpO2 99%   BMI 20.95 kg/m   Physical Exam  Constitutional: She is oriented to person, place, and time and well-developed, well-nourished, and in no distress. No distress.  HENT:  Head: Normocephalic and atraumatic.  Eyes: Pupils are equal, round, and reactive to light. Conjunctivae and EOM are normal.  Neck: Normal range of motion. Neck supple.  Cardiovascular: Normal rate, regular rhythm and intact distal pulses. Exam reveals no gallop and no friction rub.  No murmur heard. Pulmonary/Chest: Effort normal and breath sounds normal. No respiratory distress. She has no wheezes.  Abdominal: Soft. Bowel sounds are normal. There is no abdominal tenderness.  Musculoskeletal: Normal range of motion.        General: No tenderness or edema.  Lymphadenopathy:    She has no cervical adenopathy.  Neurological: She is alert and oriented to person, place, and time. Gait normal.  Skin: Skin is warm and dry.  Psychiatric: Mood, memory, affect and judgment normal.  Nursing note and vitals reviewed.    ASSESSMENT/PLAN: 1. Weight loss, unintentional - Thyroid Panel With TSH  2. Anemia, unspecified type - Comprehensive metabolic panel - CBC with Differential - Iron, TIBC and Ferritin Panel  3. Depressed mood Patient given information on counselors in the area.  - Thyroid Panel With TSH  4. Hemorrhoids, unspecified hemorrhoid type - Ambulatory referral to Gastroenterology  5. Encounter for Nexplanon removal  Patient given informed consent for removal of her nexplanon. time out taken. Implant site identified.  Area prepped in usual sterile fashon. One cc of 1% lidocaine was used to anesthetize the area at the  distal end of the implant. A small stab incision was made right beside the implant on the distal portion.  The implant was grasped using hemostats and removed without difficulty.  There was less than 3 cc blood loss. There were no complications.  A small amount of antibiotic ointment and steri-strips were applied over the small incision.  A pressure bandage was applied to reduce any bruising.  The patient tolerated  the procedure well and was given post procedure instructions.  Return in about 1 year (around 06/15/2020) for general physical.  The patient was given clear instructions to go to ER or return to medical center if symptoms don't improve, worsen or new problems develop. The patient verbalized understanding. The patient was told to call to get lab results if they haven't heard anything in the next week.     This note has been created with Education officer, environmentalDragon speech recognition software and smart phrase technology. Any transcriptional errors are unintentional.   Ms. Andr L. Riley Lamouglas, FNP-BC [facility] [address]  [place], [place] [id number] [phone]

## 2019-06-17 ENCOUNTER — Encounter: Payer: Self-pay | Admitting: Gastroenterology

## 2019-06-17 LAB — CBC WITH DIFFERENTIAL/PLATELET
Basophils Absolute: 0 10*3/uL (ref 0.0–0.2)
Basos: 0 %
EOS (ABSOLUTE): 0.1 10*3/uL (ref 0.0–0.4)
Eos: 1 %
Hematocrit: 37.9 % (ref 34.0–46.6)
Hemoglobin: 13.1 g/dL (ref 11.1–15.9)
Immature Grans (Abs): 0 10*3/uL (ref 0.0–0.1)
Immature Granulocytes: 0 %
Lymphocytes Absolute: 2.1 10*3/uL (ref 0.7–3.1)
Lymphs: 22 %
MCH: 31.4 pg (ref 26.6–33.0)
MCHC: 34.6 g/dL (ref 31.5–35.7)
MCV: 91 fL (ref 79–97)
Monocytes Absolute: 0.7 10*3/uL (ref 0.1–0.9)
Monocytes: 7 %
Neutrophils Absolute: 6.7 10*3/uL (ref 1.4–7.0)
Neutrophils: 70 %
Platelets: 281 10*3/uL (ref 150–450)
RBC: 4.17 x10E6/uL (ref 3.77–5.28)
RDW: 13.3 % (ref 11.7–15.4)
WBC: 9.7 10*3/uL (ref 3.4–10.8)

## 2019-06-17 LAB — IRON,TIBC AND FERRITIN PANEL
Ferritin: 26 ng/mL (ref 15–150)
Iron Saturation: 35 % (ref 15–55)
Iron: 125 ug/dL (ref 27–159)
Total Iron Binding Capacity: 359 ug/dL (ref 250–450)
UIBC: 234 ug/dL (ref 131–425)

## 2019-06-17 LAB — COMPREHENSIVE METABOLIC PANEL
ALT: 9 IU/L (ref 0–32)
AST: 19 IU/L (ref 0–40)
Albumin/Globulin Ratio: 1.8 (ref 1.2–2.2)
Albumin: 4.8 g/dL (ref 3.9–5.0)
Alkaline Phosphatase: 59 IU/L (ref 39–117)
BUN/Creatinine Ratio: 21 (ref 9–23)
BUN: 12 mg/dL (ref 6–20)
Bilirubin Total: 1.1 mg/dL (ref 0.0–1.2)
CO2: 23 mmol/L (ref 20–29)
Calcium: 9.5 mg/dL (ref 8.7–10.2)
Chloride: 101 mmol/L (ref 96–106)
Creatinine, Ser: 0.57 mg/dL (ref 0.57–1.00)
GFR calc Af Amer: 148 mL/min/{1.73_m2} (ref >59)
GFR calc non Af Amer: 128 mL/min/{1.73_m2} (ref >59)
Globulin, Total: 2.7 g/dL (ref 1.5–4.5)
Glucose: 84 mg/dL (ref 65–99)
Potassium: 4 mmol/L (ref 3.5–5.2)
Sodium: 137 mmol/L (ref 134–144)
Total Protein: 7.5 g/dL (ref 6.0–8.5)

## 2019-06-17 LAB — THYROID PANEL WITH TSH
Free Thyroxine Index: 2.2 (ref 1.2–4.9)
T3 Uptake Ratio: 28 % (ref 24–39)
T4, Total: 8 ug/dL (ref 4.5–12.0)
TSH: 2.49 u[IU]/mL (ref 0.450–4.500)

## 2019-06-24 ENCOUNTER — Ambulatory Visit: Payer: Medicaid Other | Admitting: Gastroenterology

## 2019-06-25 ENCOUNTER — Ambulatory Visit: Payer: Medicaid Other | Admitting: Family Medicine

## 2019-06-28 ENCOUNTER — Ambulatory Visit: Payer: Medicaid Other | Admitting: Family Medicine

## 2019-07-07 ENCOUNTER — Encounter (HOSPITAL_COMMUNITY): Payer: Self-pay

## 2019-07-07 ENCOUNTER — Encounter (HOSPITAL_COMMUNITY): Payer: Self-pay | Admitting: *Deleted

## 2019-08-03 ENCOUNTER — Encounter: Payer: Self-pay | Admitting: Gastroenterology

## 2019-08-03 ENCOUNTER — Ambulatory Visit: Payer: Medicaid Other | Admitting: Gastroenterology

## 2019-08-03 ENCOUNTER — Other Ambulatory Visit: Payer: Self-pay

## 2019-08-03 VITALS — BP 100/60 | HR 79 | Temp 96.6°F | Ht 70.0 in | Wt 155.0 lb

## 2019-08-03 DIAGNOSIS — K625 Hemorrhage of anus and rectum: Secondary | ICD-10-CM

## 2019-08-03 DIAGNOSIS — R1084 Generalized abdominal pain: Secondary | ICD-10-CM

## 2019-08-03 DIAGNOSIS — R112 Nausea with vomiting, unspecified: Secondary | ICD-10-CM | POA: Diagnosis not present

## 2019-08-03 DIAGNOSIS — R194 Change in bowel habit: Secondary | ICD-10-CM

## 2019-08-03 MED ORDER — NA SULFATE-K SULFATE-MG SULF 17.5-3.13-1.6 GM/177ML PO SOLN: 1.0000 | Freq: Once | ORAL | 0 refills | Status: AC

## 2019-08-03 MED ORDER — DICYCLOMINE HCL 10 MG PO CAPS: 10.0000 mg | ORAL_CAPSULE | Freq: Four times a day (QID) | ORAL | 1 refills | Status: DC | PRN

## 2019-08-03 NOTE — Progress Notes (Signed)
[facility] Consult Note:  History: Suzanne Lamb 08/03/2019  Referring provider: Mike Gip, FNP  Reason for consult/chief complaint: Abdominal Pain (after eating lots of pain, nausea and vomiting, feels like a knot is sitting in her stomach, bloated and gassy, weight is going up and down), Constipation, Diarrhea, Gastroesophageal Reflux, Hemorrhoids, and Blood In Stools (some bleeding)   Subjective  HPI:  This is a 28 year old woman referred by primary care for evaluation of multiple GI symptoms.  It was somewhat difficult to get a clear history, but it sounds like she has had at least a few years of chronic abdominal pain with altered bowel habits and intermittent rectal bleeding.  She feels that ever since the birth of her son in 2017 she has had episodic abdominal pain that is usually more toward the right and just above the umbilicus.  It used to come on every few months, then every few weeks and now a couple times a week.  It is sudden onset, very severe and she might feel it in her chest and her back as well.  There might be nausea and vomiting with the episodes.  When it happens, the area feels "hard, like a knot".  She also tends to alternate between constipation and diarrhea.  During periods of constipation she may have some bleeding that she has attributed to hemorrhoids.  Antacids, PPI, MiraLAX have not been helpful to relieve any symptoms.  She went to the urgent care in June with these symptoms, no imaging done, and no abdominal imaging is found in her chart.  ROS:  Review of Systems  Constitutional: Negative for appetite change and unexpected weight change.  HENT: Negative for mouth sores and voice change.   Eyes: Negative for pain and redness.  Respiratory: Negative for cough and shortness of breath.   Cardiovascular: Negative for chest pain and palpitations.  Genitourinary: Negative for dysuria and hematuria.  Musculoskeletal: Negative for  arthralgias and myalgias.  Skin: Negative for pallor and rash.  Neurological: Negative for weakness and headaches.  Hematological: Negative for adenopathy.  Psychiatric/Behavioral:       She was having problems with anxiety until a change in her birth control.   Weight is "up-and-down".  Past Medical History: Past Medical History:  Diagnosis Date  . Anemia   . Bell's palsy 2014  . Depression   . Hx of chlamydia infection   . Hx of gonorrhea      Past Surgical History: Past Surgical History:  Procedure Laterality Date  . NO PAST SURGERIES       Family History: Family History  Problem Relation Age of Onset  . Diabetes Mother   . Hypertension Mother   . Kidney disease Mother   . Diabetes Sister   . Diabetes Maternal Grandmother   . Kidney disease Maternal Grandmother   . Alcohol abuse Neg Hx   . Arthritis Neg Hx   . Asthma Neg Hx   . Birth defects Neg Hx   . Cancer Neg Hx   . COPD Neg Hx   . Depression Neg Hx   . Drug abuse Neg Hx   . Early death Neg Hx   . Hearing loss Neg Hx   . Heart disease Neg Hx   . Hyperlipidemia Neg Hx   . Learning disabilities Neg Hx   . Mental illness Neg Hx   . Mental retardation Neg Hx   . Miscarriages / Stillbirths Neg Hx   . Stroke Neg Hx   .  Vision loss Neg Hx   . Colon cancer Neg Hx   . Liver cancer Neg Hx   . Stomach cancer Neg Hx   . Esophageal cancer Neg Hx   . Pancreatic cancer Neg Hx     Social History: Social History   Socioeconomic History  . Marital status: Single    Spouse name: Not on file  . Number of children: Not on file  . Years of education: Not on file  . Highest education level: Not on file  Occupational History  . Not on file  Social Needs  . Financial resource strain: Not on file  . Food insecurity    Worry: Not on file    Inability: Not on file  . Transportation needs    Medical: Not on file    Non-medical: Not on file  Tobacco Use  . Smoking status: Never Smoker  . Smokeless tobacco:  Never Used  Substance and Sexual Activity  . Alcohol use: No  . Drug use: Not Currently    Types: Marijuana    Comment: stopped with pos UPT-did not start back  . Sexual activity: Yes    Partners: Male  Lifestyle  . Physical activity    Days per week: Not on file    Minutes per session: Not on file  . Stress: Not on file  Relationships  . Social Musicianconnections    Talks on phone: Not on file    Gets together: Not on file    Attends religious service: Not on file    Active member of club or organization: Not on file    Attends meetings of clubs or organizations: Not on file    Relationship status: Not on file  Other Topics Concern  . Not on file  Social History Narrative  . Not on file    Allergies: No Known Allergies  Outpatient Meds: No current outpatient medications on file.   No current facility-administered medications for this visit.       ___________________________________________________________________ Objective   Exam:  BP 100/60   Pulse 79   Temp (!) 96.6 F (35.9 C)   Ht 5\' 10"  (1.778 m)   Wt 155 lb (70.3 kg)   LMP 07/21/2019   BMI 22.24 kg/m    General: Thin and otherwise well-appearing.  Eyes: sclera anicteric, no redness  ENT: oral mucosa moist without lesions, no cervical or supraclavicular lymphadenopathy  CV: RRR without murmur, S1/S2, no JVD, no peripheral edema  Resp: clear to auscultation bilaterally, normal RR and effort noted  GI: soft, no tenderness, with active bowel sounds. No guarding or palpable organomegaly noted.  No asymmetry of the abdominal wall, no scars.  Skin; warm and dry, no rash or jaundice noted  Neuro: awake, alert and oriented x 3. Normal gross motor function and fluent speech  Labs:  CBC Latest Ref Rng & Units 06/16/2019 03/16/2017 11/24/2015  WBC 3.4 - 10.8 x10E3/uL 9.7 31.3(H) 20.2(H)  Hemoglobin 11.1 - 15.9 g/dL 16.113.1 11.1(L) 9.4(L)  Hematocrit 34.0 - 46.6 % 37.9 32.1(L) 28.3(L)  Platelets 150 - 450  x10E3/uL 281 283 281   CMP Latest Ref Rng & Units 06/16/2019 07/09/2013  Glucose 65 - 99 mg/dL 84 88  BUN 6 - 20 mg/dL 12 5(L)  Creatinine 0.960.57 - 1.00 mg/dL 0.450.57 4.090.66  Sodium 811134 - 144 mmol/L 137 141  Potassium 3.5 - 5.2 mmol/L 4.0 3.5  Chloride 96 - 106 mmol/L 101 104  CO2 20 - 29 mmol/L 23 28  Calcium 8.7 - 10.2 mg/dL 9.5 9.2  Total Protein 6.0 - 8.5 g/dL 7.5 7.6  Total Bilirubin 0.0 - 1.2 mg/dL 1.1 0.8  Alkaline Phos 39 - 117 IU/L 59 94  AST 0 - 40 IU/L 19 28  ALT 0 - 32 IU/L 9 16   Albumin 4.8 Thyroid testing nml  Iron studies normal   Radiologic Studies:  None for review  Assessment: Encounter Diagnoses  Name Primary?  . Generalized abdominal pain Yes  . Nausea and vomiting in adult   . Rectal bleeding   . Altered bowel habits     Episodic generalized abdominal pain, but often centered more on the right, happening more often this year.  Also altered bowel habits with intermittent rectal bleeding of unclear relation to the symptoms. Possible biliary colic, though location she points to on exam not quite typical for that.  Raises question of possible intermittent obstructive symptoms such as Crohn's disease, no prior surgery to suggest adhesions.  Possible functional disorder/intestinal spasm with hemorrhoidal bleeding, especially given alternating constipation and diarrhea.  Plan:  CT abdomen and pelvis with contrast EGD and colonoscopy.  She is agreeable after discussion of procedures and risks.  The benefits and risks of the planned procedure were described in detail with the patient or (when appropriate) their health care proxy.  Risks were outlined as including, but not limited to, bleeding, infection, perforation, adverse medication reaction leading to cardiac or pulmonary decompensation, pancreatitis (if ERCP).  The limitation of incomplete mucosal visualization was also discussed.  No guarantees or warranties were given.  As needed dicyclomine  Thank you for  the courtesy of this consult.  Please call me with any questions or concerns.  Nelida Meuse III  CC: Referring provider noted above

## 2019-08-03 NOTE — Patient Instructions (Signed)
If you are age 26 or older, your body mass index should be between 23-30. Your Body mass index is 22.24 kg/m. If this is out of the aforementioned range listed, please consider follow up with your Primary Care Provider.  If you are age 71 or younger, your body mass index should be between 19-25. Your Body mass index is 22.24 kg/m. If this is out of the aformentioned range listed, please consider follow up with your Primary Care Provider.   You have been scheduled for an endoscopy. Please follow written instructions given to you at your visit today. If you use inhalers (even only as needed), please bring them with you on the day of your procedure. Your physician has requested that you go to www.startemmi.com and enter the access code given to you at your visit today. This web site gives a general overview about your procedure. However, you should still follow specific instructions given to you by our office regarding your preparation for the procedure.  You have been scheduled for a CT scan of the abdomen and pelvis at [facility] ([address]---this is in the same building as [employer]).   You are scheduled on 08-13-2019 at 10am. You should arrive 15 minutes prior to your appointment time for registration. Please follow the written instructions below on the day of your exam:  WARNING: IF YOU ARE ALLERGIC TO IODINE/X-RAY DYE, PLEASE NOTIFY RADIOLOGY IMMEDIATELY AT [id number]! YOU WILL BE GIVEN A 13 HOUR PREMEDICATION PREP.  1) Do not eat or drink anything after 6am (4 hours prior to your test) 2) You have been given 2 bottles of oral contrast to drink. The solution may taste better if refrigerated, but do NOT add ice or any other liquid to this solution. Shake well before drinking.    Drink 1 bottle of contrast @ 8am (2 hours prior to your exam)  Drink 1 bottle of contrast @ 9am (1 hour prior to your exam)  You may take any medications as prescribed with a small  amount of water, if necessary. If you take any of the following medications: METFORMIN, GLUCOPHAGE, GLUCOVANCE, AVANDAMET, RIOMET, FORTAMET, [place] MET, JANUMET, GLUMETZA or METAGLIP, you MAY be asked to HOLD this medication 48 hours AFTER the exam.  The purpose of you drinking the oral contrast is to aid in the visualization of your intestinal tract. The contrast solution may cause some diarrhea. Depending on your individual set of symptoms, you may also receive an intravenous injection of x-ray contrast/dye. Plan on being at [facility] for 30 minutes or longer, depending on the type of exam you are having performed.  This test typically takes 30-45 minutes to complete.  If you have any questions regarding your exam or if you need to reschedule, you may call the CT department at [phone] between the hours of 8:00 am and 5:00 pm, Monday-Friday.  ________________________________________________________________________    It was a pleasure to see you today!  Dr. Loletha Carrow

## 2019-08-13 ENCOUNTER — Other Ambulatory Visit: Payer: Self-pay

## 2019-08-13 ENCOUNTER — Ambulatory Visit (INDEPENDENT_AMBULATORY_CARE_PROVIDER_SITE_OTHER)
Admission: RE | Admit: 2019-08-13 | Discharge: 2019-08-13 | Disposition: A | Discharge status: Home / Self Care | Payer: Medicaid Other | Source: Ambulatory Visit | Attending: Gastroenterology | Admitting: Gastroenterology

## 2019-08-13 DIAGNOSIS — R194 Change in bowel habit: Secondary | ICD-10-CM

## 2019-08-13 DIAGNOSIS — K625 Hemorrhage of anus and rectum: Secondary | ICD-10-CM

## 2019-08-13 DIAGNOSIS — R112 Nausea with vomiting, unspecified: Secondary | ICD-10-CM

## 2019-08-13 DIAGNOSIS — R1084 Generalized abdominal pain: Secondary | ICD-10-CM

## 2019-08-13 MED ORDER — IOHEXOL 300 MG/ML  SOLN
100.0000 mL | Freq: Once | INTRAMUSCULAR | Status: AC | PRN
  Administered 2019-08-13: 100 mL via INTRAVENOUS

## 2019-08-17 ENCOUNTER — Telehealth: Payer: Self-pay

## 2019-08-17 NOTE — Telephone Encounter (Signed)
Covid-19 screening questions   Do you now or have you had a fever in the last 14 days? NO   Do you have any respiratory symptoms of shortness of breath or cough now or in the last 14 days? NO  Do you have any family members or close contacts with diagnosed or suspected Covid-19 in the past 14 days? NO  Have you been tested for Covid-19 and found to be positive? NO        

## 2019-08-18 ENCOUNTER — Other Ambulatory Visit: Payer: Self-pay

## 2019-08-18 ENCOUNTER — Ambulatory Visit (AMBULATORY_SURGERY_CENTER): Payer: Medicaid Other | Admitting: Gastroenterology

## 2019-08-18 ENCOUNTER — Encounter: Payer: Self-pay | Admitting: Gastroenterology

## 2019-08-18 ENCOUNTER — Telehealth: Payer: Self-pay | Admitting: *Deleted

## 2019-08-18 VITALS — BP 108/62 | HR 74 | Temp 98.4°F | Resp 11 | Ht 70.0 in | Wt 155.0 lb

## 2019-08-18 DIAGNOSIS — K625 Hemorrhage of anus and rectum: Secondary | ICD-10-CM | POA: Diagnosis not present

## 2019-08-18 DIAGNOSIS — R194 Change in bowel habit: Secondary | ICD-10-CM | POA: Diagnosis not present

## 2019-08-18 DIAGNOSIS — R1084 Generalized abdominal pain: Secondary | ICD-10-CM

## 2019-08-18 DIAGNOSIS — R1011 Right upper quadrant pain: Secondary | ICD-10-CM

## 2019-08-18 MED ORDER — SODIUM CHLORIDE 0.9 % IV SOLN: 500.0000 mL | Freq: Once | INTRAVENOUS | Status: DC

## 2019-08-18 NOTE — Op Note (Signed)
[facility] Patient Name: Suzanne Lamb Procedure Date: 08/18/2019 3:26 PM MRN: [id number] Endoscopist: Sherilyn Cooter L. Myrtie Neither , MD Age: 28 Referring MD:  Date of Birth: 01-25-1991 Gender: Female Account #: [id number] Procedure:                Colonoscopy Indications:              Abdominal pain in the right upper quadrant, Rectal                            bleeding, Change in bowel habits (recent CT scan                            with gallstones) Medicines:                Monitored Anesthesia Care Procedure:                Pre-Anesthesia Assessment:                           - Prior to the procedure, a History and Physical                            was performed, and patient medications and                            allergies were reviewed. The patient's tolerance of                            previous anesthesia was also reviewed. The risks                            and benefits of the procedure and the sedation                            options and risks were discussed with the patient.                            All questions were answered, and informed consent                            was obtained. Prior Anticoagulants: The patient has                            taken no previous anticoagulant or antiplatelet                            agents. ASA Grade Assessment: I - A normal, healthy                            patient. After reviewing the risks and benefits,                            the patient was deemed in satisfactory condition to  undergo the procedure.                           After obtaining informed consent, the colonoscope                            was passed under direct vision. Throughout the                            procedure, the patient's blood pressure, pulse, and                            oxygen saturations were monitored continuously. The                            Colonoscope was introduced through the anus and                       advanced to the the terminal ileum, with                            identification of the appendiceal orifice and IC                            valve. The colonoscopy was performed without                            difficulty. The patient tolerated the procedure                            well. The quality of the bowel preparation was                            excellent. The terminal ileum, ileocecal valve,                            appendiceal orifice, and rectum were photographed. Scope In: 3:41:30 PM Scope Out: 3:49:53 PM Scope Withdrawal Time: 0 hours 5 minutes 53 seconds  Total Procedure Duration: 0 hours 8 minutes 23 seconds  Findings:                 The perianal and digital rectal examinations were                            normal.                           The terminal ileum appeared normal.                           The entire examined colon appeared normal on direct                            and retroflexion views. Complications:            No immediate complications. Estimated Blood Loss:     Estimated blood loss:  none. Impression:               - The examined portion of the ileum was normal.                           - The entire examined colon is normal on direct and                            retroflexion views.                           - No specimens collected. Recommendation:           - Patient has a contact number available for                            emergencies. The signs and symptoms of potential                            delayed complications were discussed with the                            patient. Return to normal activities tomorrow.                            Written discharge instructions were provided to the                            patient.                           - Resume previous diet.                           - Continue present medications.                           - No recommendation at this time regarding repeat                             colonoscopy due to young age.                           - See the other procedure note for documentation of                            additional recommendations. Henry L. Loletha Carrow, MD 08/18/2019 4:00:14 PM This report has been signed electronically.

## 2019-08-18 NOTE — Progress Notes (Signed)
Pt's states no medical or surgical changes since previsit or office visit.  LC temps, CW vitals and SH IV.

## 2019-08-18 NOTE — Op Note (Signed)
[facility] Patient Name: Suzanne DerbyKeisha Lamb Procedure Date: 08/18/2019 3:26 PM MRN: [id number] Endoscopist: Sherilyn CooterHenry L. Myrtie Neitheranis , MD Age: [age] Referring MD:  Date of Birth: 11/09/1990 Gender: Female Account #: [id number] Procedure:                Upper GI endoscopy Indications:              Abdominal pain in the right upper quadrant                            (episodic for years, escalating, recent CT with                            gallstones. lower GI symptoms also - see                            colonoscopy report) Medicines:                Monitored Anesthesia Care Procedure:                Pre-Anesthesia Assessment:                           - Prior to the procedure, a History and Physical                            was performed, and patient medications and                            allergies were reviewed. The patient's tolerance of                            previous anesthesia was also reviewed. The risks                            and benefits of the procedure and the sedation                            options and risks were discussed with the patient.                            All questions were answered, and informed consent                            was obtained. Prior Anticoagulants: The patient has                            taken no previous anticoagulant or antiplatelet                            agents. ASA Grade Assessment: I - A normal, healthy                            patient. After reviewing the risks and benefits,  the patient was deemed in satisfactory condition to                            undergo the procedure.                           After obtaining informed consent, the endoscope was                            passed under direct vision. Throughout the                            procedure, the patient's blood pressure, pulse, and                            oxygen saturations were monitored continuously. The               Endoscope was introduced through the mouth, and                            advanced to the second part of duodenum. The upper                            GI endoscopy was accomplished without difficulty.                            The patient tolerated the procedure well. Scope In: Scope Out: Findings:                 The esophagus was normal.                           The stomach was normal.                           The examined duodenum was normal. Complications:            No immediate complications. Estimated Blood Loss:     Estimated blood loss: none. Impression:               - Normal esophagus.                           - Normal stomach.                           - Normal examined duodenum.                           - No specimens collected.                           Most consistent with biliary colic. Recommendation:           - Patient has a contact number available for                            emergencies. The signs and symptoms of potential  delayed complications were discussed with the                            patient. Return to normal activities tomorrow.                            Written discharge instructions were provided to the                            patient.                           - Resume previous diet.                           - Continue present medications.                           - See the other procedure note for documentation of                            additional recommendations.                           - Refer to a surgeon at appointment to be scheduled                            to plan cholecystectomy. Aino Heckert L. Myrtie Neither, MD 08/18/2019 4:03:09 PM This report has been signed electronically.

## 2019-08-18 NOTE — Telephone Encounter (Signed)
Referral for biliary colic faxed to [facility].

## 2019-08-18 NOTE — Patient Instructions (Signed)
YOU MAY RESUME YOUR PREVIOUS DIET AND MEDICATION SCHEDULE TODAY.  Worthing YOU FOR ALLOWING [place] TO CARE FOR YOU TODAY!!  YOU HAD AN ENDOSCOPIC PROCEDURE TODAY AT [facility]:   Refer to the procedure report that was given to you for any specific questions about what was found during the examination.  If the procedure report does not answer your questions, please call your gastroenterologist to clarify.  If you requested that your care partner not be given the details of your procedure findings, then the procedure report has been included in a sealed envelope for you to review at your convenience later.  YOU SHOULD EXPECT: Some feelings of bloating in the abdomen. Passage of more gas than usual.  Walking can help get rid of the air that was put into your GI tract during the procedure and reduce the bloating. If you had a lower endoscopy (such as a colonoscopy or flexible sigmoidoscopy) you may notice spotting of blood in your stool or on the toilet paper. If you underwent a bowel prep for your procedure, you may not have a normal bowel movement for a few days.  Please Note:  You might notice some irritation and congestion in your nose or some drainage.  This is from the oxygen used during your procedure.  There is no need for concern and it should clear up in a day or so.  SYMPTOMS TO REPORT IMMEDIATELY:   Following lower endoscopy (colonoscopy or flexible sigmoidoscopy):  Excessive amounts of blood in the stool  Significant tenderness or worsening of abdominal pains  Swelling of the abdomen that is new, acute  Fever of 100F or higher   Following upper endoscopy (EGD)  Vomiting of blood or coffee ground material  New chest pain or pain under the shoulder blades  Painful or persistently difficult swallowing  New shortness of breath  Fever of 100F or higher  Black, tarry-looking stools  For urgent or emergent issues, a gastroenterologist can be reached at any hour by calling  [phone].   DIET:  We do recommend a small meal at first, but then you may proceed to your regular diet.  Drink plenty of fluids but you should avoid alcoholic beverages for 24 hours.  ACTIVITY:  You should plan to take it easy for the rest of today and you should NOT DRIVE or use heavy machinery until tomorrow (because of the sedation medicines used during the test).    FOLLOW UP: Our staff will call the number listed on your records 48-72 hours following your procedure to check on you and address any questions or concerns that you may have regarding the information given to you following your procedure. If we do not reach you, we will leave a message.  We will attempt to reach you two times.  During this call, we will ask if you have developed any symptoms of COVID 19. If you develop any symptoms (ie: fever, flu-like symptoms, shortness of breath, cough etc.) before then, please call [phone].  If you test positive for Covid 19 in the 2 weeks post procedure, please call and report this information to [place].    If any biopsies were taken you will be contacted by phone or by letter within the next 1-3 weeks.  Please call us at [phone] if you have not heard about the biopsies in 3 weeks.    SIGNATURES/CONFIDENTIALITY: You and/or your care partner have signed paperwork which will be entered into your electronic medical record.  These signatures attest to the fact that that the information above on your After Visit Summary has been reviewed and is understood.  Full responsibility of the confidentiality of this discharge information lies with you and/or your care-partner.

## 2019-08-18 NOTE — Progress Notes (Signed)
PT taken to PACU. Monitors in place. VSS. Report given to RN. 

## 2019-08-20 ENCOUNTER — Telehealth: Payer: Self-pay

## 2019-08-20 ENCOUNTER — Telehealth: Payer: Self-pay | Admitting: *Deleted

## 2019-08-20 NOTE — Telephone Encounter (Signed)
  Follow up Call-  Call back number 08/18/2019  Post procedure Call Back phone  # [phone]  Permission to leave phone message Yes  Some recent data might be hidden    LMOM to call back with any questions or concerns.  Also, call back if patient has developed fever, respiratory issues or been dx with COVID or had any family members or close contacts diagnosed since her procedure.

## 2019-08-20 NOTE — Telephone Encounter (Signed)
LVM

## 2019-08-30 ENCOUNTER — Telehealth: Payer: Self-pay | Admitting: *Deleted

## 2019-08-30 NOTE — Telephone Encounter (Signed)
Followed up with [facility], the patient has been registered by not currently scheduled.  Will continue to f/u.

## 2019-09-10 ENCOUNTER — Telehealth: Payer: Self-pay | Admitting: *Deleted

## 2019-09-10 NOTE — Telephone Encounter (Signed)
Followed up with [facility], the patient has been scheduled with Dr. Greer Pickerel on 11/19 at 10:30 am for a consultation.

## 2019-09-16 ENCOUNTER — Ambulatory Visit: Payer: Self-pay | Admitting: General Surgery

## 2019-09-16 NOTE — H&P (Signed)
Suzanne Lamb Documented: 09/16/2019 10:37 AM Location: [facility] Patient #: [id number] DOB: 1991-06-12 Single / Language: Suzanne Lamb / Race: Black or African American Female  History of Present Illness Suzanne Lamb; 09/16/2019 11:01 AM) The patient is a 28 year old female who presents for evaluation of gall stones. She is referred by Dr Loletha Carrow for evaluation of abdominal pain and gallstones. She has been having abdominal issues since 2017. Mostly in the right upper abdomen. generally after eating. Will generally occur at night. She describes pain in the right upper abdomen. Sometimes it'll radiate to her back. She'll have nausea and bloating with it she will occasionally make herself vomit in order to feel better. Episodes can last up to several hours. She has been avoiding foods lately because of the increased frequency of episodes. She saw gastroenterology and underwent an upper and lower endoscopy which were both normal. She denies any prior abdominal surgery. She had a CT scan that showed cholelithiasis. She has a family member that has had gallbladder surgery. She denies any frequent NSAIDs. She does not smoke. Minimal alcohol. Works as a Haematologist.   Nurse, children's Suzanne Lamb; 09/16/2019 11:01 AM) SYMPTOMATIC CHOLELITHIASIS (K80.20)  Past Surgical History (Suzanne Lamb, Suzanne Lamb; 09/16/2019 10:37 AM) No pertinent past surgical history  Diagnostic Studies History (Suzanne Lamb, Suzanne Lamb; 09/16/2019 10:37 AM) Colonoscopy within last year Mammogram never Pap Smear >5 years ago  Allergies (Suzanne Lamb, Suzanne Lamb; 09/16/2019 10:38 AM) No Known Drug Allergies [09/16/2019]: Allergies Reconciled  Medication History (Suzanne Lamb, [name]; 09/16/2019 10:38 AM) No Current Medications Medications Reconciled  Pregnancy / Birth History (Suzanne Lamb, Lamb; 09/16/2019 10:37 AM) Age at menarche 33 years. Length (months) of  breastfeeding 3-6 Maternal age 86-20 Para 2 Regular periods     Review of Systems Suzanne Lamb; 09/16/2019 10:59 AM) Skin Not Present- Change in Wart/Mole, Dryness, Hives, Jaundice, New Lesions, Non-Healing Wounds, Rash and Ulcer. HEENT Not Present- Earache, Hearing Loss, Hoarseness, Nose Bleed, Oral Ulcers, Ringing in the Ears, Seasonal Allergies, Sinus Pain, Sore Throat, Visual Disturbances, Wears glasses/contact lenses and Yellow Eyes. Breast Not Present- Breast Mass, Breast Pain, Nipple Discharge and Skin Changes. Gastrointestinal Present- Abdominal Pain, Bloating, Change in Bowel Habits, Constipation, Excessive gas, Gets full quickly at meals, Indigestion, Nausea and Rectal Pain. Not Present- Bloody Stool, Chronic diarrhea, Difficulty Swallowing, Hemorrhoids and Vomiting. Musculoskeletal Not Present- Back Pain, Joint Pain, Joint Stiffness, Muscle Pain, Muscle Weakness and Swelling of Extremities. Neurological Not Present- Decreased Memory, Fainting, Headaches, Numbness, Seizures, Tingling, Tremor, Trouble walking and Weakness. Endocrine Not Present- Cold Intolerance, Excessive Hunger, Hair Changes, Heat Intolerance, Hot flashes and New Diabetes. Hematology Not Present- Blood Thinners, Easy Bruising, Excessive bleeding, Gland problems, HIV and Persistent Infections. All other systems negative  Vitals (Suzanne Lamb; 09/16/2019 10:38 AM) 09/16/2019 10:38 AM Lamb: 153.2 lb Height: 70in Body Surface Area: 1.86 m Body Mass Index: 21.98 kg/m  Temp.: 38F  Pulse: 102 (Regular)  BP: 124/82 (Sitting, Left Arm, Standard)        Physical Exam Suzanne Lamb M. Ervin Rothbauer Lamb; 09/16/2019 10:59 AM)  General Mental Status-Alert. General Appearance-Consistent with stated age. Hydration-Well hydrated. Voice-Normal.  Head and Neck Head-normocephalic, atraumatic with no lesions or palpable masses. Trachea-midline. Thyroid Gland Characteristics - normal  size and consistency.  Eye Eyeball - Bilateral-Extraocular movements intact. Sclera/Conjunctiva - Bilateral-No scleral icterus.  ENMT Ears Pinna - Bilateral - no bony growth in lateral aspect of ear canal, no edema.  Nose and Sinuses External Inspection of the Nose - symmetric, no deformities observed.  Chest and Lung Exam Chest and lung exam reveals -quiet, even and easy respiratory effort with no use of accessory muscles and on auscultation, normal breath sounds, no adventitious sounds and normal vocal resonance. Inspection Chest Wall - Normal. Back - normal.  Breast - Did not examine.  Cardiovascular Cardiovascular examination reveals -normal heart sounds, regular rate and rhythm with no murmurs and normal pedal pulses bilaterally.  Abdomen Inspection Inspection of the abdomen reveals - No Hernias. Skin - Scar - no surgical scars. Palpation/Percussion Palpation and Percussion of the abdomen reveal - Soft, Non Tender, No Rebound tenderness, No Rigidity (guarding) and No hepatosplenomegaly. Auscultation Auscultation of the abdomen reveals - Bowel sounds normal.  Peripheral Vascular Upper Extremity Palpation - Pulses bilaterally normal.  Neurologic Neurologic evaluation reveals -alert and oriented x 3 with no impairment of recent or remote memory. Mental Status-Normal.  Neuropsychiatric The patient's mood and affect are described as -normal. Judgment and Insight-insight is appropriate concerning matters relevant to self.  Musculoskeletal Normal Exam - Left-Upper Extremity Strength Normal and Lower Extremity Strength Normal. Normal Exam - Right-Upper Extremity Strength Normal and Lower Extremity Strength Normal.  Lymphatic Head & Neck  General Head & Neck Lymphatics: Bilateral - Description - Normal. Axillary - Did not examine. Femoral & Inguinal - Did not examine.    Assessment & Plan Minerva Areola M. Reya Aurich Lamb; 09/16/2019 10:58 AM)  SYMPTOMATIC  CHOLELITHIASIS (K80.20) Impression: I believe the patient's symptoms are consistent with gallbladder disease.  We discussed gallbladder disease. The patient was given Agricultural engineer. We discussed non-operative and operative management. We discussed the signs & symptoms of acute cholecystitis  I discussed laparoscopic cholecystectomy with IOC in detail. The patient was given educational material as well as diagrams detailing the procedure. We discussed the risks and benefits of a laparoscopic cholecystectomy including, but not limited to bleeding, infection, injury to surrounding structures such as the intestine or liver, bile leak, retained gallstones, need to convert to an open procedure, prolonged diarrhea, blood clots such as DVT, common bile duct injury, anesthesia risks, and possible need for additional procedures. We discussed the typical post-operative recovery course. I explained that the likelihood of improvement of their symptoms is good.  The patient has elected to proceed with surgery.  Current Plans Pt Education - Pamphlet Given - Laparoscopic Gallbladder Surgery: discussed with patient and provided information. You are being scheduled for surgery- Our schedulers will call you.  You should hear from our office's scheduling department within 5 working days about the location, date, and time of surgery. We try to make accommodations for patient's preferences in scheduling surgery, but sometimes the OR schedule or the surgeon's schedule prevents [place] from making those accommodations.  If you have not heard from our office [phone]) in 5 working days, call the office and ask for your surgeon's nurse.  If you have other questions about your diagnosis, plan, or surgery, call the office and ask for your surgeon's nurse.  Mary Sella. Andrey Campanile, Lamb, FACS General, Bariatric, & Minimally Invasive Surgery [facility], [place]

## 2019-11-18 ENCOUNTER — Other Ambulatory Visit: Payer: Self-pay | Admitting: General Surgery

## 2019-11-30 IMAGING — CT CT ABD-PELV W/ CM
2 of 4 series · 16 of 46 positions shown, 18 images · IV contrast (OMNIPAQUE 300)
Comparison: None.

CLINICAL DATA: Abdominal pain, rectal bleeding, nausea, vomiting
for 3 years

EXAM:
CT ABDOMEN AND PELVIS WITH CONTRAST
TECHNIQUE: Multidetector CT imaging of the abdomen and pelvis was performed
using the standard protocol following bolus administration of
intravenous contrast.
CONTRAST:  100mL OMNIPAQUE IOHEXOL 300 MG/ML SOLN, additional oral
enteric contrast

[Series 2: abd/pel w · axial · 0.72mm/px · z∈[-571,-191]mm · 13 of 84 slices shown, 15 images]
[im 4/84  soft-tissue]
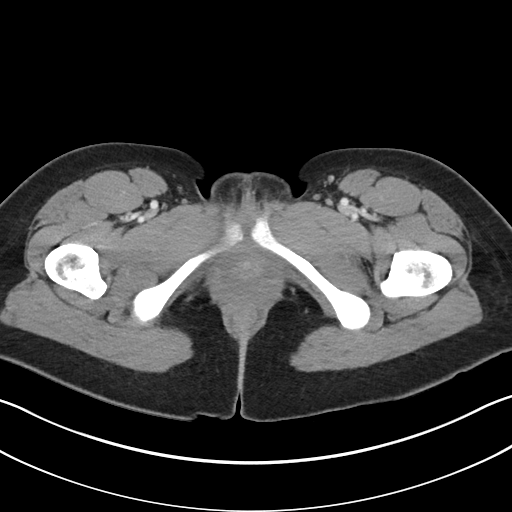
[im 4/84  bone]
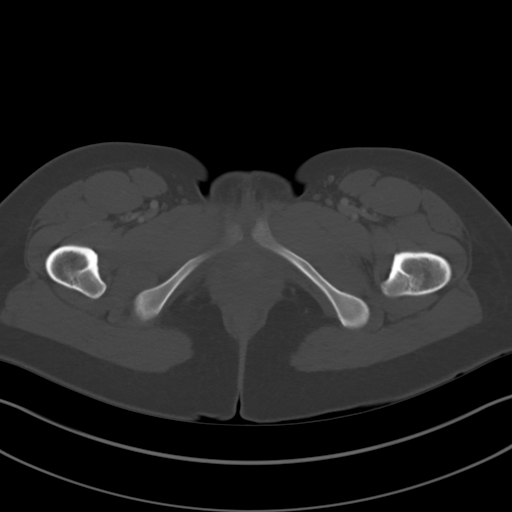
[im 11/84  soft-tissue]
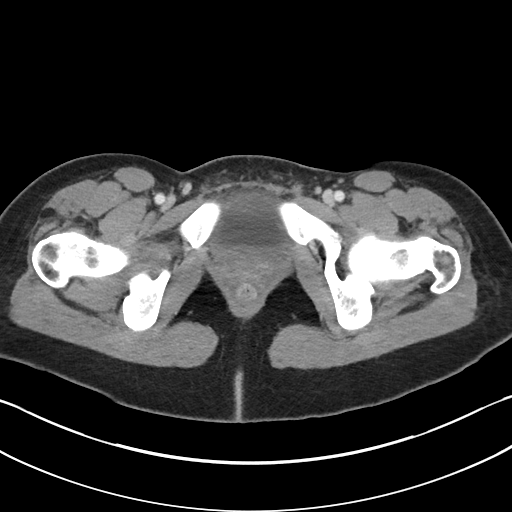
[im 18/84  soft-tissue]
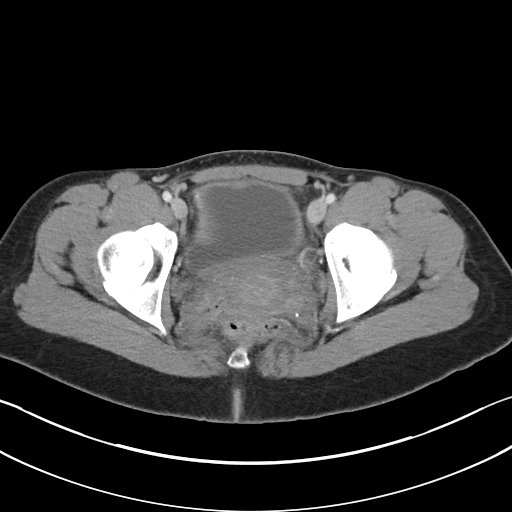
[im 25/84  soft-tissue]
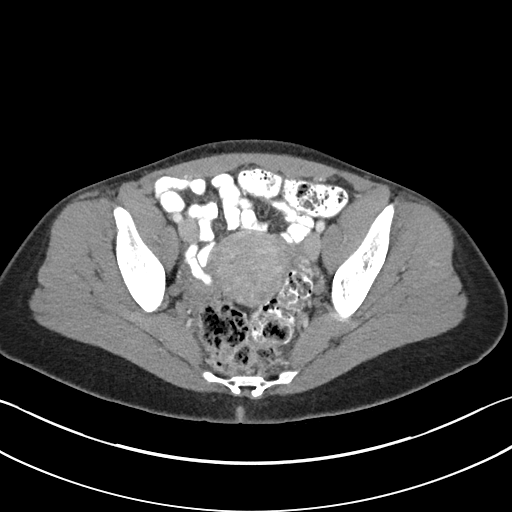
[im 28/84  soft-tissue]
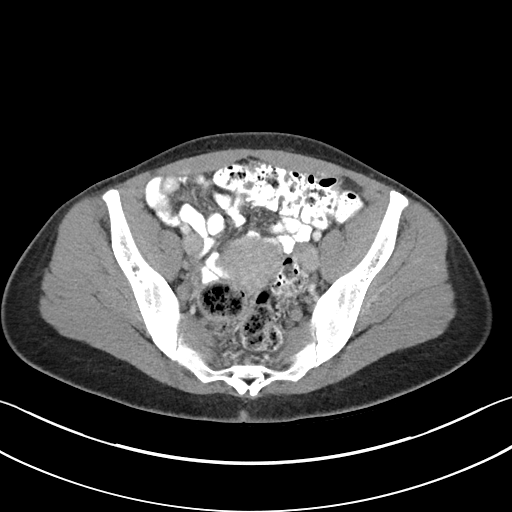
[im 35/84  soft-tissue]
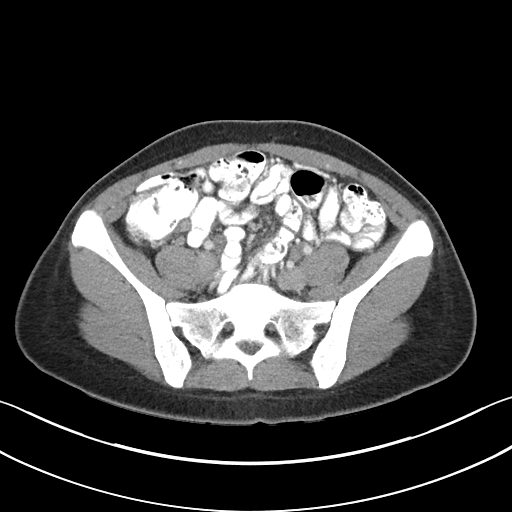
[im 42/84  soft-tissue]
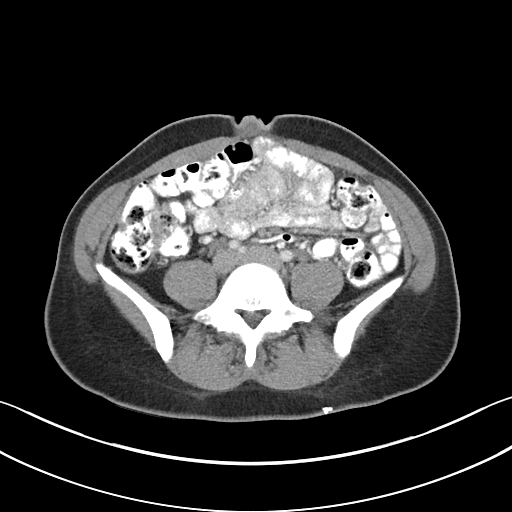
[im 49/84  soft-tissue]
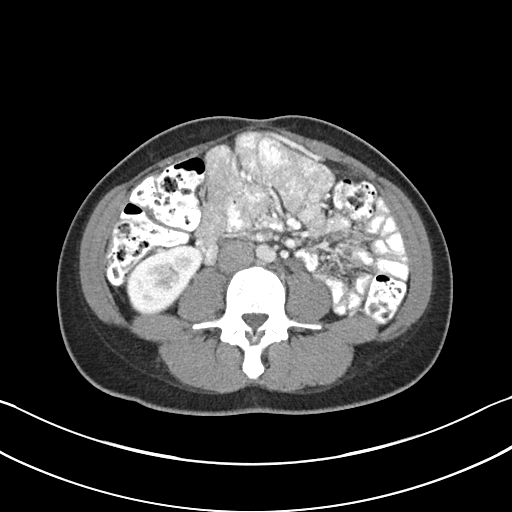
[im 56/84  soft-tissue]
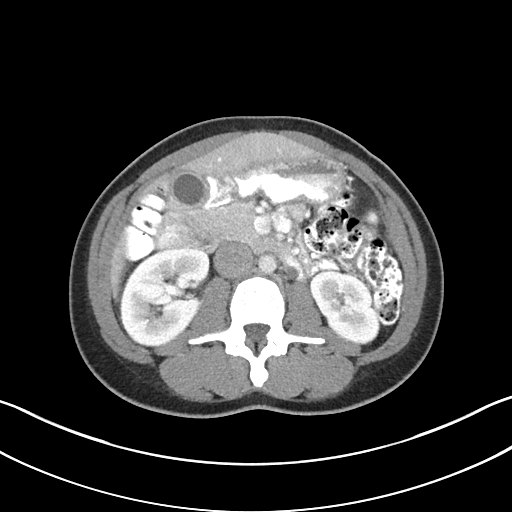
[im 56/84  bone]
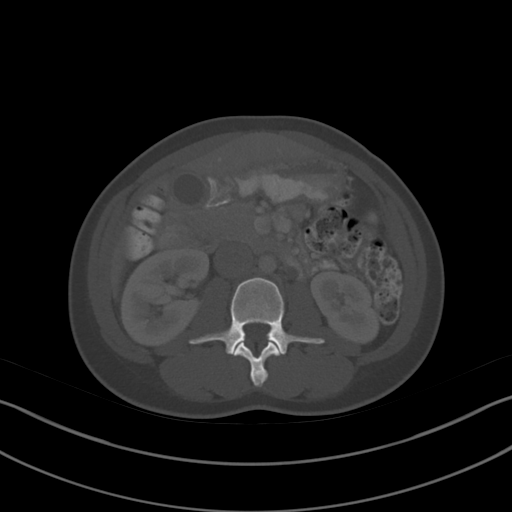
[im 59/84  soft-tissue]
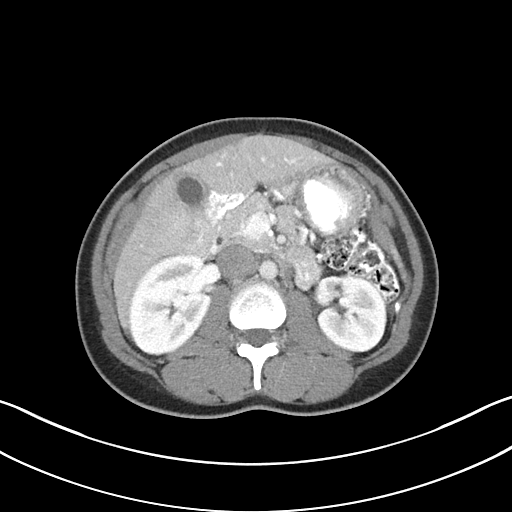
[im 66/84  soft-tissue]
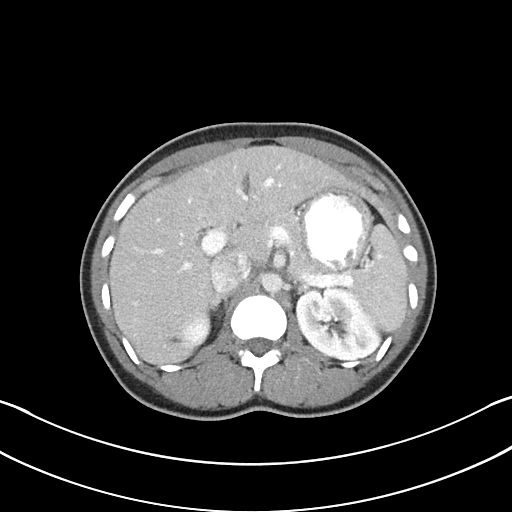
[im 73/84  soft-tissue]
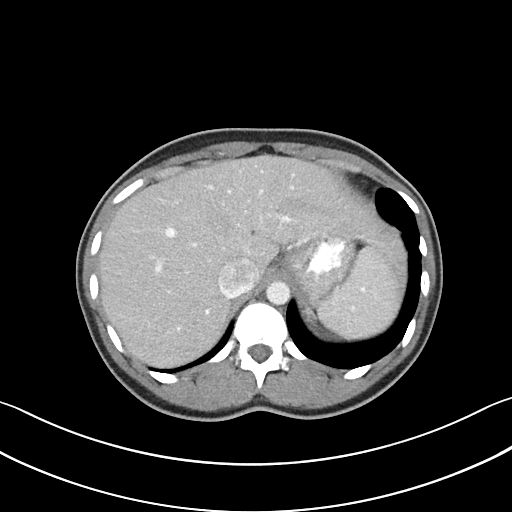
[im 80/84  soft-tissue]
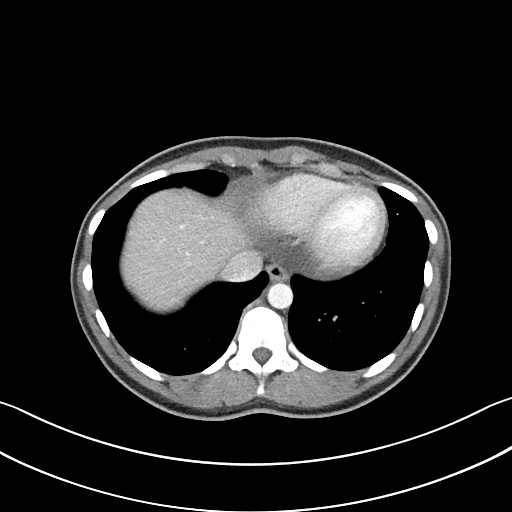

[Series 5: abd/pel w st · coronal · 0.68mm/px · 3 of 81 slices shown]
[im 27/81  soft-tissue]
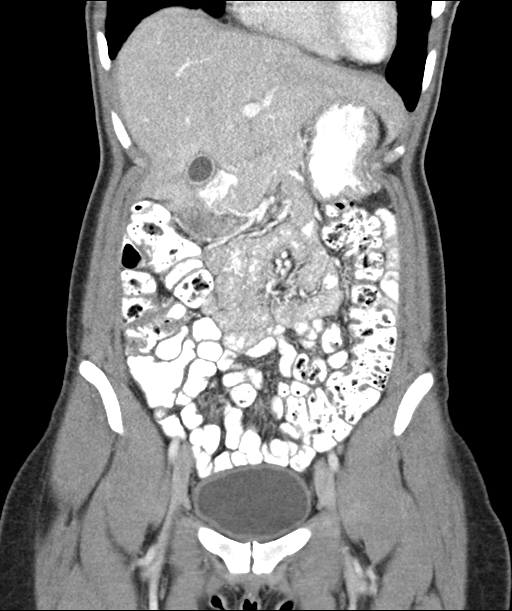
[im 36/81  soft-tissue]
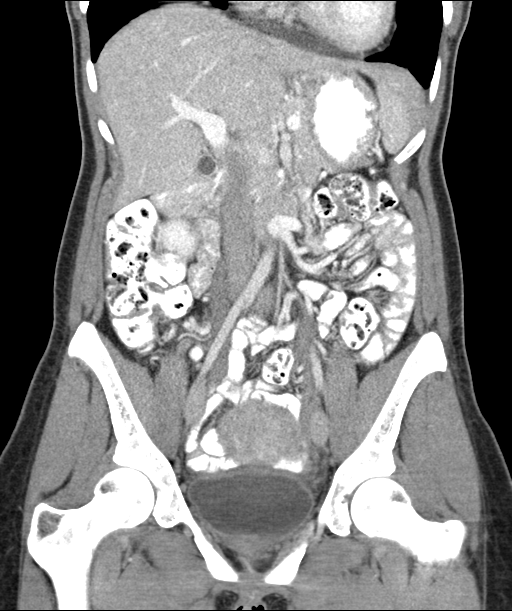
[im 45/81  soft-tissue]
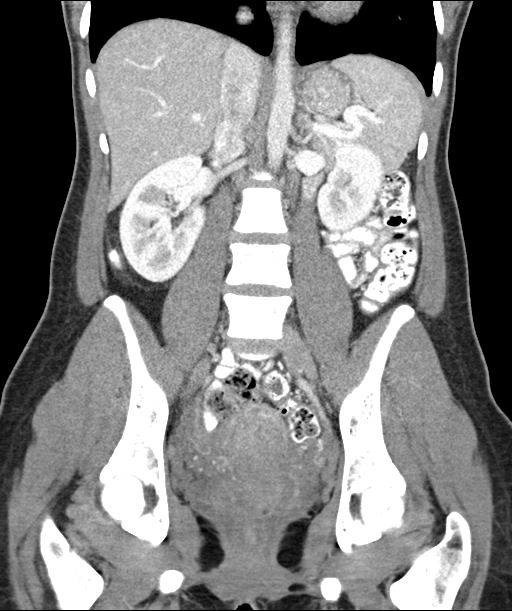

[16 of 46 positions shown; findings below may reference images not displayed]

FINDINGS: Lower chest: No acute abnormality.

Hepatobiliary: No solid liver abnormality is seen. Multiple
gallstones in the gallbladder fundus. Mild gallbladder wall
thickening throughout, up to approximately 3 mm. No biliary
dilatation.

Pancreas: Unremarkable. No pancreatic ductal dilatation or
surrounding inflammatory changes.

Spleen: Normal in size without significant abnormality.

Adrenals/Urinary Tract: Adrenal glands are unremarkable. Kidneys are
normal, without renal calculi, solid lesion, or hydronephrosis.
Bladder is unremarkable.

Stomach/Bowel: Stomach is within normal limits. Appendix appears
normal. No evidence of bowel wall thickening, distention, or
inflammatory changes.

Vascular/Lymphatic: No significant vascular findings are present. No
enlarged abdominal or pelvic lymph nodes.

Reproductive: No mass or other significant abnormality.

Other: No abdominal wall hernia or abnormality. No abdominopelvic
ascites.

Musculoskeletal: No acute or significant osseous findings.
IMPRESSION: 1. Multiple gallstones in the gallbladder fundus. Mild gallbladder
wall thickening throughout, up to approximately 3 mm. No biliary
ductal dilatation.

2. No other CT findings of the abdomen or pelvis to explain
abdominal pain, rectal bleeding, nausea, or vomiting.

## 2020-06-27 ENCOUNTER — Ambulatory Visit
Admission: EM | Admit: 2020-06-27 | Discharge: 2020-06-27 | Disposition: A | Discharge status: Home / Self Care | Payer: Medicaid Other | Attending: Emergency Medicine | Admitting: Emergency Medicine

## 2020-06-27 ENCOUNTER — Encounter: Payer: Self-pay | Admitting: Emergency Medicine

## 2020-06-27 ENCOUNTER — Other Ambulatory Visit: Payer: Self-pay

## 2020-06-27 DIAGNOSIS — N898 Other specified noninflammatory disorders of vagina: Secondary | ICD-10-CM

## 2020-06-27 LAB — POCT URINE PREGNANCY: Preg Test, Ur: NEGATIVE

## 2020-06-27 MED ORDER — METRONIDAZOLE 500 MG PO TABS: 500.0000 mg | ORAL_TABLET | Freq: Two times a day (BID) | ORAL | 0 refills | Status: AC

## 2020-06-27 NOTE — Discharge Instructions (Signed)
Metronidazole twice daily for 1 week. No alcohol while taking  We are testing you for Gonorrhea, Chlamydia, Trichomonas, Yeast and Bacterial Vaginosis. We will call you if anything is positive and let you know if you require any further treatment. Please inform partners of any positive results.   Please return if symptoms not improving with treatment, development of fever, nausea, vomiting, abdominal pain.

## 2020-06-27 NOTE — ED Provider Notes (Signed)
[name]    CSN: [id number] Arrival date & time: 06/27/20  1812      History   Chief Complaint Chief Complaint  Patient presents with  . Vaginal Discharge    HPI Suzanne Lamb is a 29 y.o. female presenting today for evaluation of vaginal discharge. Reports clear discharge with irritation for 1-2 days. Concerned about std's.  She also reports history of bacterial vaginosis which she does feel her symptoms will similar to when she has had BV in the past.  Last menstrual cycle was approximately 2 weeks ago.  HPI  Past Medical History:  Diagnosis Date  . Anemia   . Bell's palsy 2014  . Depression   . Hx of chlamydia infection   . Hx of gonorrhea     There are no problems to display for this patient.   Past Surgical History:  Procedure Laterality Date  . NO PAST SURGERIES      OB History    Gravida  2   Para  2   Term  2   Preterm      AB      Living  2     SAB      TAB      Ectopic      Multiple  0   Live Births  2            Home Medications    Prior to Admission medications   Medication Sig Start Date End Date Taking? Authorizing Provider  dicyclomine (BENTYL) 10 MG capsule Take 1 capsule (10 mg total) by mouth every 6 (six) hours as needed for spasms. For abdominal cramps Patient not taking: Reported on 06/27/2020 08/03/19   Sherrilyn Rist, MD  metroNIDAZOLE (FLAGYL) 500 MG tablet Take 1 tablet (500 mg total) by mouth 2 (two) times daily for 7 days. 06/27/20 07/04/20  Yasuko Lapage, Junius Creamer, PA-C    Family History Family History  Problem Relation Age of Onset  . Diabetes Mother   . Hypertension Mother   . Kidney disease Mother   . Diabetes Sister   . Diabetes Maternal Grandmother   . Kidney disease Maternal Grandmother   . Alcohol abuse Neg Hx   . Arthritis Neg Hx   . Asthma Neg Hx   . Birth defects Neg Hx   . Cancer Neg Hx   . COPD Neg Hx   . Depression Neg Hx   . Drug abuse Neg Hx   . Early death Neg Hx   .  Hearing loss Neg Hx   . Heart disease Neg Hx   . Hyperlipidemia Neg Hx   . Learning disabilities Neg Hx   . Mental illness Neg Hx   . Mental retardation Neg Hx   . Miscarriages / Stillbirths Neg Hx   . Stroke Neg Hx   . Vision loss Neg Hx   . Colon cancer Neg Hx   . Liver cancer Neg Hx   . Stomach cancer Neg Hx   . Esophageal cancer Neg Hx   . Pancreatic cancer Neg Hx     Social History Social History   Tobacco Use  . Smoking status: Never Smoker  . Smokeless tobacco: Never Used  Vaping Use  . Vaping Use: Never used  Substance Use Topics  . Alcohol use: No  . Drug use: Not Currently    Types: Marijuana    Comment: stopped with pos UPT-did not start back     Allergies  Patient has no known allergies.   Review of Systems Review of Systems  Constitutional: Negative for fever.  Respiratory: Negative for shortness of breath.   Cardiovascular: Negative for chest pain.  Gastrointestinal: Negative for abdominal pain, diarrhea, nausea and vomiting.  Genitourinary: Positive for vaginal discharge. Negative for dysuria, flank pain, genital sores, hematuria, menstrual problem, vaginal bleeding and vaginal pain.  Musculoskeletal: Negative for back pain.  Skin: Negative for rash.  Neurological: Negative for dizziness, light-headedness and headaches.     Physical Exam Triage Vital Signs ED Triage Vitals  Enc Vitals Group     BP 06/27/20 1918 111/71     Pulse Rate 06/27/20 1918 71     Resp 06/27/20 1918 16     Temp 06/27/20 1918 98.5 F (36.9 C)     Temp Source 06/27/20 1918 Oral     SpO2 06/27/20 1918 97 %     Weight --      Height --      Head Circumference --      Peak Flow --      Pain Score 06/27/20 1928 0     Pain Loc --      Pain Edu? --      Excl. in GC? --    No data found.  Updated Vital Signs BP 111/71 (BP Location: Left Arm)   Pulse 71   Temp 98.5 F (36.9 C) (Oral)   Resp 16   SpO2 97%   Visual Acuity Right Eye Distance:   Left Eye  Distance:   Bilateral Distance:    Right Eye Near:   Left Eye Near:    Bilateral Near:     Physical Exam Vitals and nursing note reviewed.  Constitutional:      Appearance: She is well-developed.     Comments: No acute distress  HENT:     Head: Normocephalic and atraumatic.     Nose: Nose normal.  Eyes:     Conjunctiva/sclera: Conjunctivae normal.  Cardiovascular:     Rate and Rhythm: Normal rate.  Pulmonary:     Effort: Pulmonary effort is normal. No respiratory distress.  Abdominal:     General: There is no distension.  Musculoskeletal:        General: Normal range of motion.     Cervical back: Neck supple.  Skin:    General: Skin is warm and dry.  Neurological:     Mental Status: She is alert and oriented to person, place, and time.      UC Treatments / Results  Labs (all labs ordered are listed, but only abnormal results are displayed) Labs Reviewed  POCT URINE PREGNANCY  CERVICOVAGINAL ANCILLARY ONLY    EKG   Radiology No results found.  Procedures Procedures (including critical care time)  Medications Ordered in UC Medications - No data to display  Initial Impression / Assessment and Plan / UC Course  I have reviewed the triage vital signs and the nursing notes.  Pertinent labs & imaging results that were available during my care of the patient were reviewed by me and considered in my medical decision making (see chart for details).     Pregnancy test negative, vaginal swab pending for STDs.  Empirically treating for BV today with metronidazole.  Will call with results and provide further treatment as needed based off results.  Discussed strict return precautions. Patient verbalized understanding and is agreeable with plan.  Final Clinical Impressions(s) / UC Diagnoses   Final diagnoses:  Vaginal discharge  Discharge Instructions     Metronidazole twice daily for 1 week. No alcohol while taking  We are testing you for Gonorrhea,  Chlamydia, Trichomonas, Yeast and Bacterial Vaginosis. We will call you if anything is positive and let you know if you require any further treatment. Please inform partners of any positive results.   Please return if symptoms not improving with treatment, development of fever, nausea, vomiting, abdominal pain.     ED Prescriptions    Medication Sig Dispense Auth. Provider   metroNIDAZOLE (FLAGYL) 500 MG tablet Take 1 tablet (500 mg total) by mouth 2 (two) times daily for 7 days. 14 tablet Ryver Poblete, [place] C, PA-C     PDMP not reviewed this encounter.   Lew Dawes, [place] 06/28/20 1243

## 2020-06-27 NOTE — ED Triage Notes (Signed)
Pt sts vaginal discharge and concern for STD

## 2020-06-29 LAB — CERVICOVAGINAL ANCILLARY ONLY
Bacterial Vaginitis (gardnerella): POSITIVE — AB
Candida Glabrata: NEGATIVE
Candida Vaginitis: NEGATIVE
Chlamydia: NEGATIVE
Comment: NEGATIVE
Comment: NEGATIVE
Comment: NEGATIVE
Comment: NEGATIVE
Comment: NEGATIVE
Comment: NORMAL
Neisseria Gonorrhea: NEGATIVE
Trichomonas: POSITIVE — AB

## 2020-09-29 ENCOUNTER — Other Ambulatory Visit: Payer: Self-pay

## 2020-09-29 ENCOUNTER — Encounter (INDEPENDENT_AMBULATORY_CARE_PROVIDER_SITE_OTHER): Payer: Self-pay | Admitting: Primary Care

## 2020-09-29 ENCOUNTER — Ambulatory Visit (INDEPENDENT_AMBULATORY_CARE_PROVIDER_SITE_OTHER): Payer: Medicaid Other | Admitting: Primary Care

## 2020-09-29 ENCOUNTER — Other Ambulatory Visit (HOSPITAL_COMMUNITY)
Admission: RE | Admit: 2020-09-29 | Discharge: 2020-09-29 | Disposition: A | Discharge status: Home / Self Care | Payer: Medicaid Other | Source: Ambulatory Visit | Attending: Primary Care | Admitting: Primary Care

## 2020-09-29 VITALS — BP 114/72 | HR 77 | Ht 70.0 in | Wt 163.0 lb

## 2020-09-29 DIAGNOSIS — Z131 Encounter for screening for diabetes mellitus: Secondary | ICD-10-CM

## 2020-09-29 DIAGNOSIS — N898 Other specified noninflammatory disorders of vagina: Secondary | ICD-10-CM

## 2020-09-29 DIAGNOSIS — Z7689 Persons encountering health services in other specified circumstances: Secondary | ICD-10-CM | POA: Diagnosis not present

## 2020-09-29 LAB — POCT GLYCOSYLATED HEMOGLOBIN (HGB A1C): Hemoglobin A1C: 5.2 % (ref 4.0–5.6)

## 2020-09-29 NOTE — Progress Notes (Signed)
New patient has not seen a doctor in a while.  Wants to get PAP smear done.

## 2020-09-29 NOTE — Progress Notes (Signed)
New Patient Office Visit  Subjective:  Patient ID: Suzanne Lamb, female    DOB: 01/17/1991  Age: 29 y.o. MRN: [id number]  CC:  Chief Complaint  Patient presents with  . New Patient (Initial Visit)    HPI [name] is a 29 year old female who presents for establishment of care.No complaints or concerns .  Past Medical History:  Diagnosis Date  . Anemia   . Bell's palsy 2014  . Depression   . Hx of chlamydia infection   . Hx of gonorrhea     Past Surgical History:  Procedure Laterality Date  . NO PAST SURGERIES      Family History  Problem Relation Age of Onset  . Diabetes Mother   . Hypertension Mother   . Kidney disease Mother   . Diabetes Sister   . Diabetes Maternal Grandmother   . Kidney disease Maternal Grandmother   . Alcohol abuse Neg Hx   . Arthritis Neg Hx   . Asthma Neg Hx   . Birth defects Neg Hx   . Cancer Neg Hx   . COPD Neg Hx   . Depression Neg Hx   . Drug abuse Neg Hx   . Early death Neg Hx   . Hearing loss Neg Hx   . Heart disease Neg Hx   . Hyperlipidemia Neg Hx   . Learning disabilities Neg Hx   . Mental illness Neg Hx   . Mental retardation Neg Hx   . Miscarriages / Stillbirths Neg Hx   . Stroke Neg Hx   . Vision loss Neg Hx   . Colon cancer Neg Hx   . Liver cancer Neg Hx   . Stomach cancer Neg Hx   . Esophageal cancer Neg Hx   . Pancreatic cancer Neg Hx     Social History   Socioeconomic History  . Marital status: Single    Spouse name: Not on file  . Number of children: Not on file  . Years of education: Not on file  . Highest education level: Not on file  Occupational History  . Not on file  Tobacco Use  . Smoking status: Never Smoker  . Smokeless tobacco: Never Used  Vaping Use  . Vaping Use: Never used  Substance and Sexual Activity  . Alcohol use: No  . Drug use: Not Currently    Types: Marijuana    Comment: stopped with pos UPT-did not start back  . Sexual activity: Yes    Partners: Male  Other  Topics Concern  . Not on file  Social History Narrative  . Not on file   Social Determinants of Health   Financial Resource Strain:   . Difficulty of Paying Living Expenses: Not on file  Food Insecurity:   . Worried About Programme researcher, broadcasting/film/video in the Last Year: Not on file  . Ran Out of Food in the Last Year: Not on file  Transportation Needs:   . Lack of Transportation (Medical): Not on file  . Lack of Transportation (Non-Medical): Not on file  Physical Activity:   . Days of Exercise per Week: Not on file  . Minutes of Exercise per Session: Not on file  Stress:   . Feeling of Stress : Not on file  Social Connections:   . Frequency of Communication with Friends and Family: Not on file  . Frequency of Social Gatherings with Friends and Family: Not on file  . Attends Religious Services: Not  on file  . Active Member of Clubs or Organizations: Not on file  . Attends Banker Meetings: Not on file  . Marital Status: Not on file  Intimate Partner Violence:   . Fear of Current or Ex-Partner: Not on file  . Emotionally Abused: Not on file  . Physically Abused: Not on file  . Sexually Abused: Not on file    ROS Review of Systems  Genitourinary: Positive for vaginal discharge.  All other systems reviewed and are negative.   Objective:   Today's Vitals: BP 114/72   Pulse 77   Ht 5\' 10"  (1.778 m)   Wt 163 lb (73.9 kg)   SpO2 100%   BMI 23.39 kg/m   Physical Exam Vitals reviewed.  Constitutional:      Appearance: Normal appearance. She is normal weight.  HENT:     Head: Normocephalic.     Right Ear: Tympanic membrane normal.     Left Ear: Tympanic membrane normal.     Nose: Nose normal.  Eyes:     Extraocular Movements: Extraocular movements intact.     Pupils: Pupils are equal, round, and reactive to light.  Cardiovascular:     Rate and Rhythm: Normal rate and regular rhythm.  Pulmonary:     Effort: Pulmonary effort is normal.     Breath sounds: Normal  breath sounds.  Abdominal:     General: Bowel sounds are normal.     Palpations: Abdomen is soft.  Musculoskeletal:        General: Normal range of motion.     Cervical back: Normal range of motion.  Skin:    General: Skin is warm and dry.  Neurological:     Mental Status: She is alert and oriented to person, place, and time.  Psychiatric:        Mood and Affect: Mood normal.        Thought Content: Thought content normal.        Judgment: Judgment normal.     Assessment & Plan:  Suzanne Lamb was seen today for new patient (initial visit).  Diagnoses and all orders for this visit:  Encounter to establish care Establish care with new PCP  Vaginal discharge -     Cervicovaginal ancillary only  Screening for diabetes mellitus -     POCT glycosylated hemoglobin (Hb A1C)     Outpatient Encounter Medications as of 09/29/2020  Medication Sig  . dicyclomine (BENTYL) 10 MG capsule Take 1 capsule (10 mg total) by mouth every 6 (six) hours as needed for spasms. For abdominal cramps (Patient not taking: Reported on 06/27/2020)   No facility-administered encounter medications on file as of 09/29/2020.    Follow-up: No follow-ups on file.   14/12/2019, NP

## 2020-10-03 ENCOUNTER — Other Ambulatory Visit (INDEPENDENT_AMBULATORY_CARE_PROVIDER_SITE_OTHER): Payer: Self-pay | Admitting: Primary Care

## 2020-10-03 DIAGNOSIS — N760 Acute vaginitis: Secondary | ICD-10-CM

## 2020-10-03 DIAGNOSIS — B9689 Other specified bacterial agents as the cause of diseases classified elsewhere: Secondary | ICD-10-CM

## 2020-10-03 LAB — CERVICOVAGINAL ANCILLARY ONLY
Bacterial Vaginitis (gardnerella): POSITIVE — AB
Candida Glabrata: NEGATIVE
Candida Vaginitis: NEGATIVE
Chlamydia: NEGATIVE
Comment: NEGATIVE
Comment: NEGATIVE
Comment: NEGATIVE
Comment: NEGATIVE
Comment: NEGATIVE
Comment: NORMAL
Neisseria Gonorrhea: NEGATIVE
Trichomonas: NEGATIVE

## 2020-10-03 MED ORDER — METRONIDAZOLE 500 MG PO TABS: 500.0000 mg | ORAL_TABLET | Freq: Two times a day (BID) | ORAL | 0 refills | Status: DC

## 2020-10-03 NOTE — Progress Notes (Unsigned)
met 

## 2020-10-09 ENCOUNTER — Encounter (INDEPENDENT_AMBULATORY_CARE_PROVIDER_SITE_OTHER): Payer: Self-pay | Admitting: Primary Care

## 2020-10-09 ENCOUNTER — Other Ambulatory Visit (HOSPITAL_COMMUNITY)
Admission: RE | Admit: 2020-10-09 | Discharge: 2020-10-09 | Disposition: A | Discharge status: Home / Self Care | Payer: Medicaid Other | Source: Ambulatory Visit | Attending: Primary Care | Admitting: Primary Care

## 2020-10-09 ENCOUNTER — Other Ambulatory Visit: Payer: Self-pay

## 2020-10-09 ENCOUNTER — Ambulatory Visit (INDEPENDENT_AMBULATORY_CARE_PROVIDER_SITE_OTHER): Payer: Medicaid Other | Admitting: Primary Care

## 2020-10-09 VITALS — BP 102/66 | HR 72 | Temp 97.3°F | Ht 70.0 in | Wt 159.8 lb

## 2020-10-09 DIAGNOSIS — Z124 Encounter for screening for malignant neoplasm of cervix: Secondary | ICD-10-CM

## 2020-10-09 DIAGNOSIS — Z113 Encounter for screening for infections with a predominantly sexual mode of transmission: Secondary | ICD-10-CM | POA: Diagnosis not present

## 2020-10-09 DIAGNOSIS — Z30019 Encounter for initial prescription of contraceptives, unspecified: Secondary | ICD-10-CM

## 2020-10-09 MED ORDER — XULANE 150-35 MCG/24HR TD PTWK
1.0000 | MEDICATED_PATCH | TRANSDERMAL | 12 refills | Status: DC
Start: 2020-10-09 — End: 2021-09-18

## 2020-10-09 NOTE — Progress Notes (Signed)
Established Patient Office Visit  Subjective:  Patient ID: Suzanne Lamb, female    DOB: May 08, 1991  Age: 29 y.o. MRN: [id number]  CC:  Chief Complaint  Patient presents with   Gynecologic Exam    HPI [name] is a 29 year old female that presents for Gynecologic exam.  Past Medical History:  Diagnosis Date   Anemia    Bell's palsy 2014   Depression    Hx of chlamydia infection    Hx of gonorrhea     Past Surgical History:  Procedure Laterality Date   NO PAST SURGERIES      Family History  Problem Relation Age of Onset   Diabetes Mother    Hypertension Mother    Kidney disease Mother    Diabetes Sister    Diabetes Maternal Grandmother    Kidney disease Maternal Grandmother    Alcohol abuse Neg Hx    Arthritis Neg Hx    Asthma Neg Hx    Birth defects Neg Hx    Cancer Neg Hx    COPD Neg Hx    Depression Neg Hx    Drug abuse Neg Hx    Early death Neg Hx    Hearing loss Neg Hx    Heart disease Neg Hx    Hyperlipidemia Neg Hx    Learning disabilities Neg Hx    Mental illness Neg Hx    Mental retardation Neg Hx    Miscarriages / Stillbirths Neg Hx    Stroke Neg Hx    Vision loss Neg Hx    Colon cancer Neg Hx    Liver cancer Neg Hx    Stomach cancer Neg Hx    Esophageal cancer Neg Hx    Pancreatic cancer Neg Hx     Social History   Socioeconomic History   Marital status: Single    Spouse name: Not on file   Number of children: Not on file   Years of education: Not on file   Highest education level: Not on file  Occupational History   Not on file  Tobacco Use   Smoking status: Never Smoker   Smokeless tobacco: Never Used  Vaping Use   Vaping Use: Never used  Substance and Sexual Activity   Alcohol use: No   Drug use: Not Currently    Types: Marijuana    Comment: stopped with pos UPT-did not start back   Sexual activity: Yes    Partners: Male  Other Topics Concern   Not on file   Social History Narrative   Not on file   Social Determinants of Health   Financial Resource Strain: Not on file  Food Insecurity: Not on file  Transportation Needs: Not on file  Physical Activity: Not on file  Stress: Not on file  Social Connections: Not on file  Intimate Partner Violence: Not on file    Outpatient Medications Prior to Visit  Medication Sig Dispense Refill   metroNIDAZOLE (FLAGYL) 500 MG tablet Take 1 tablet (500 mg total) by mouth 2 (two) times daily. 14 tablet 0   dicyclomine (BENTYL) 10 MG capsule Take 1 capsule (10 mg total) by mouth every 6 (six) hours as needed for spasms. For abdominal cramps (Patient not taking: Reported on 06/27/2020) 30 capsule 1   No facility-administered medications prior to visit.    No Known Allergies  ROS Review of Systems  Genitourinary: Positive for vaginal discharge.  All other systems reviewed and are negative.  Objective:    Physical Exam CONSTITUTIONAL: Well-developed, well-nourished thin frame female in no acute distress.  HENT:  Normocephalic, atraumatic, External right and left ear normal. Oropharynx is clear and moist EYES: Conjunctivae and EOM are normal. Pupils are equal, round, and reactive to light. No scleral icterus.  NECK: Normal range of motion, supple, no masses.  Normal thyroid.  SKIN: Skin is warm and dry. No rash noted. Not diaphoretic. No erythema. No pallor. NEUROLGIC: Alert and oriented to person, place, and time. Normal reflexes, muscle tone coordination. No cranial nerve deficit noted. PSYCHIATRIC: Normal mood and affect. Normal behavior. Normal judgment and thought content. CARDIOVASCULAR: Normal heart rate noted, regular rhythm RESPIRATORY: Clear to auscultation bilaterally. Effort and breath sounds normal, no problems with respiration noted. BREASTS: Taught SBE return demonstration  ABDOMEN: Soft, normal bowel sounds, no distention noted.  No tenderness, rebound or guarding.  PELVIC:  Normal appearing external genitalia; normal appearing vaginal mucosa and cervix.  No abnormal discharge noted.  Pap smear obtained.  Normal uterine size, no other palpable masses, no uterine or adnexal tenderness. MUSCULOSKELETAL: Normal range of motion. No tenderness.  No cyanosis, clubbing, or edema.  2+ distal pulses. Health Maintenance Due  Topic Date Due   Hepatitis C Screening  Never done   PAP-Cervical Cytology Screening  06/14/2018   PAP SMEAR-Modifier  06/14/2018    There are no preventive care reminders to display for this patient.  Lab Results  Component Value Date   TSH 2.490 06/16/2019   Lab Results  Component Value Date   WBC 9.7 06/16/2019   HGB 13.1 06/16/2019   HCT 37.9 06/16/2019   MCV 91 06/16/2019   PLT 281 06/16/2019   Lab Results  Component Value Date   NA 137 06/16/2019   K 4.0 06/16/2019   CO2 23 06/16/2019   GLUCOSE 84 06/16/2019   BUN 12 06/16/2019   CREATININE 0.57 06/16/2019   BILITOT 1.1 06/16/2019   ALKPHOS 59 06/16/2019   AST 19 06/16/2019   ALT 9 06/16/2019   PROT 7.5 06/16/2019   ALBUMIN 4.8 06/16/2019   CALCIUM 9.5 06/16/2019   No results found for: CHOL No results found for: HDL No results found for: LDLCALC No results found for: TRIG No results found for: CHOLHDL Lab Results  Component Value Date   HGBA1C 5.2 09/29/2020      Assessment & Plan:   Suzanne Lamb was seen today for gynecologic exam.  Diagnoses and all orders for this visit:  Cervical cancer screening -     Cytology - PAP([place]) -     Cervicovaginal ancillary only  Screening examination for STD (sexually transmitted disease) -     HIV antibody (with reflex)  Encounter for female birth control -     norelgestromin-ethinyl estradiol [facility]) 150-35 MCG/24HR transdermal patch; Place 1 patch onto the skin once a week.    Follow-up: No follow-ups on file.    Grayce Sessions, NP

## 2020-10-09 NOTE — Patient Instructions (Signed)

## 2020-10-10 LAB — CERVICOVAGINAL ANCILLARY ONLY
Bacterial Vaginitis (gardnerella): POSITIVE — AB
Candida Glabrata: NEGATIVE
Candida Vaginitis: POSITIVE — AB
Chlamydia: NEGATIVE
Comment: NEGATIVE
Comment: NEGATIVE
Comment: NEGATIVE
Comment: NEGATIVE
Comment: NEGATIVE
Comment: NORMAL
Neisseria Gonorrhea: NEGATIVE
Trichomonas: NEGATIVE

## 2020-10-11 ENCOUNTER — Other Ambulatory Visit (INDEPENDENT_AMBULATORY_CARE_PROVIDER_SITE_OTHER): Payer: Self-pay | Admitting: Primary Care

## 2020-10-11 DIAGNOSIS — B379 Candidiasis, unspecified: Secondary | ICD-10-CM

## 2020-10-11 LAB — CYTOLOGY - PAP

## 2020-10-11 MED ORDER — FLUCONAZOLE 150 MG PO TABS: 150.0000 mg | ORAL_TABLET | Freq: Once | ORAL | 0 refills | Status: AC

## 2020-11-22 ENCOUNTER — Ambulatory Visit (INDEPENDENT_AMBULATORY_CARE_PROVIDER_SITE_OTHER): Payer: Medicaid Other | Admitting: Primary Care

## 2021-03-22 ENCOUNTER — Other Ambulatory Visit: Payer: Self-pay

## 2021-03-22 ENCOUNTER — Ambulatory Visit (HOSPITAL_COMMUNITY)
Admission: EM | Admit: 2021-03-22 | Discharge: 2021-03-22 | Disposition: A | Discharge status: Home / Self Care | Payer: Medicaid Other | Attending: Physician Assistant | Admitting: Physician Assistant

## 2021-03-22 ENCOUNTER — Encounter (HOSPITAL_COMMUNITY): Payer: Self-pay | Admitting: Emergency Medicine

## 2021-03-22 ENCOUNTER — Ambulatory Visit (INDEPENDENT_AMBULATORY_CARE_PROVIDER_SITE_OTHER): Payer: Self-pay | Admitting: *Deleted

## 2021-03-22 DIAGNOSIS — N760 Acute vaginitis: Secondary | ICD-10-CM

## 2021-03-22 DIAGNOSIS — B9689 Other specified bacterial agents as the cause of diseases classified elsewhere: Secondary | ICD-10-CM | POA: Insufficient documentation

## 2021-03-22 DIAGNOSIS — N898 Other specified noninflammatory disorders of vagina: Secondary | ICD-10-CM | POA: Insufficient documentation

## 2021-03-22 DIAGNOSIS — R1030 Lower abdominal pain, unspecified: Secondary | ICD-10-CM | POA: Insufficient documentation

## 2021-03-22 LAB — POCT URINALYSIS DIPSTICK, ED / UC
Bilirubin Urine: NEGATIVE
Glucose, UA: NEGATIVE mg/dL
Hgb urine dipstick: NEGATIVE
Ketones, ur: NEGATIVE mg/dL
Leukocytes,Ua: NEGATIVE
Nitrite: NEGATIVE
Protein, ur: NEGATIVE mg/dL
Specific Gravity, Urine: 1.02 (ref 1.005–1.030)
Urobilinogen, UA: 0.2 mg/dL (ref 0.0–1.0)
pH: 7 (ref 5.0–8.0)

## 2021-03-22 LAB — POC URINE PREG, ED: Preg Test, Ur: NEGATIVE

## 2021-03-22 MED ORDER — METRONIDAZOLE 500 MG PO TABS: 500.0000 mg | ORAL_TABLET | Freq: Two times a day (BID) | ORAL | 0 refills | Status: DC

## 2021-03-22 NOTE — ED Triage Notes (Signed)
Patient states that she inserted 2 tampons a couple of weeks ago.  Since she has removed the tampons, she's had abdominal cramping.  RLQ and LLQ pain, more stabbing and cramping.  Pain comes and goes.  Denies any OTC meds.

## 2021-03-22 NOTE — Telephone Encounter (Signed)
Pt called in c/o severe sharp abd pains that "brings her to her knees since inserting a ultra tampon back on May 6th".  See notes below.  She is sitting in the parking lot at [facility] but they don't have a provider in the office today.  She is going to the practice on [address] "since it's close to me".    "I may go to the ED at the [facility]".    I instructed her to go either place but to be evaluated as soon as possible which she was agreeable to doing.   Reason for Disposition . Patient sounds very sick or weak to the triager    She is at the [place] office close by so she is going to go there now.  Answer Assessment - Initial Assessment Questions 1. LOCATION: "Where does it hurt?"      I broke my vagina.   I put in an ultra tampon in on May 6th.   Ever since I'm having random shooting pains in my abd.   The sharp pain hits and puts me to my knees.   It happens about every other day.   2. RADIATION: "Does the pain shoot anywhere else?" (e.g., chest, back)     Pain in pelvic area shooting pains.   No vaginal pain.   I'm having discharge it's white, creamy.   No odor or itching or burning.   3. ONSET: "When did the pain begin?" (e.g., minutes, hours or days ago)      May 6th when I put in the ultra tampon.   It didn't hurt at the time of insertion. 4. SUDDEN: "Gradual or sudden onset?"     A day after putting in the tampon the sharp pains started.   5. PATTERN "Does the pain come and go, or is it constant?"    - If constant: "Is it getting better, staying the same, or worsening?"      (Note: Constant means the pain never goes away completely; most serious pain is constant and it progresses)     - If intermittent: "How long does it last?" "Do you have pain now?"     (Note: Intermittent means the pain goes away completely between bouts)     It's intermittent 6. SEVERITY: "How bad is the pain?"  (e.g., Scale 1-10; mild, moderate,  or severe)   - MILD (1-3): doesn't interfere with normal activities, abdomen soft and not tender to touch    - MODERATE (4-7): interferes with normal activities or awakens from sleep, abdomen tender to touch    - SEVERE (8-10): excruciating pain, doubled over, unable to do any normal activities      Severe  7. RECURRENT SYMPTOM: "Have you ever had this type of stomach pain before?" If Yes, ask: "When was the last time?" and "What happened that time?"      No 8. CAUSE: "What do you think is causing the stomach pain?"     I don't really know.   Maybe it's my mind freaking me out.   I don't wear tampons.   I usually wear pads. 9. RELIEVING/AGGRAVATING FACTORS: "What makes it better or worse?" (e.g., movement, antacids, bowel movement)     The pains come intermittently and are sharp. 10. OTHER SYMPTOMS: "Do you have any other symptoms?" (e.g., back pain, diarrhea, fever, urination pain, vomiting)       No  11. PREGNANCY: "Is there any chance  you are pregnant?" "When was your last menstrual period?"       Maybe  So far  Protocols used: ABDOMINAL PAIN - [facility]

## 2021-03-22 NOTE — ED Provider Notes (Signed)
[facility]    CSN: [id number] Arrival date & time: 03/22/21  9449      History   Chief Complaint Chief Complaint  Patient presents with  . Abdominal Pain    HPI Suzanne Lamb is a 30 y.o. female.   Patient presents today with a several week history of intermittent lower abdominal/pelvic pain.  Reports symptoms began soon after she accidentally placed to tampons into her vagina on her menstrual cycle on 03/02/2021.  She was able to remove both of these but has had intermittent pain since that time.  She does report increased vaginal discharge which she describes as copious, clear, malodorous.  She has a history of bacterial vaginosis and states discharge symptoms are similar to previous episodes of this condition but typically she does not have pain.  Reports pain is intermittent without identifiable trigger.  She is currently asymptomatic but during episodes pain is rated 8 on a 0-10 pain scale, localized to lower abdomen without radiation, described as sharp, no aggravating or alleviating factors identified.  She has not tried any medication for symptom management.  She does report changing her soap but denies additional changes to personal hygiene products including detergents.  Denies any recent antibiotic use.  She denies any new sexual partners.  She is open to STI testing for further evaluation.     Past Medical History:  Diagnosis Date  . Anemia   . Bell's palsy 2014  . Depression   . Hx of chlamydia infection   . Hx of gonorrhea     There are no problems to display for this patient.   Past Surgical History:  Procedure Laterality Date  . NO PAST SURGERIES      OB History    Gravida  2   Para  2   Term  2   Preterm      AB      Living  2     SAB      IAB      Ectopic      Multiple  0   Live Births  2            Home Medications    Prior to Admission medications   Medication Sig Start Date End Date Taking? Authorizing Provider   metroNIDAZOLE (FLAGYL) 500 MG tablet Take 1 tablet (500 mg total) by mouth 2 (two) times daily. 03/22/21   Kamareon Sciandra, Noberto Retort, PA-C  norelgestromin-ethinyl estradiol [facility]) 150-35 MCG/24HR transdermal patch Place 1 patch onto the skin once a week. 10/09/20   Grayce Sessions, NP    Family History Family History  Problem Relation Age of Onset  . Diabetes Mother   . Hypertension Mother   . Kidney disease Mother   . Diabetes Sister   . Diabetes Maternal Grandmother   . Kidney disease Maternal Grandmother   . Alcohol abuse Neg Hx   . Arthritis Neg Hx   . Asthma Neg Hx   . Birth defects Neg Hx   . Cancer Neg Hx   . COPD Neg Hx   . Depression Neg Hx   . Drug abuse Neg Hx   . Early death Neg Hx   . Hearing loss Neg Hx   . Heart disease Neg Hx   . Hyperlipidemia Neg Hx   . Learning disabilities Neg Hx   . Mental illness Neg Hx   . Mental retardation Neg Hx   . Miscarriages / Stillbirths Neg Hx   .  Stroke Neg Hx   . Vision loss Neg Hx   . Colon cancer Neg Hx   . Liver cancer Neg Hx   . Stomach cancer Neg Hx   . Esophageal cancer Neg Hx   . Pancreatic cancer Neg Hx     Social History Social History   Tobacco Use  . Smoking status: Never Smoker  . Smokeless tobacco: Never Used  Vaping Use  . Vaping Use: Never used  Substance Use Topics  . Alcohol use: No  . Drug use: Not Currently    Types: Marijuana    Comment: stopped with pos UPT-did not start back     Allergies   Patient has no known allergies.   Review of Systems Review of Systems  Constitutional: Negative for activity change, appetite change, fatigue and fever.  Respiratory: Negative for cough and shortness of breath.   Cardiovascular: Negative for chest pain.  Gastrointestinal: Positive for abdominal pain. Negative for diarrhea, nausea and vomiting.  Genitourinary: Positive for pelvic pain and vaginal discharge. Negative for dysuria, frequency, hematuria, urgency, vaginal bleeding and vaginal pain.   Musculoskeletal: Negative for arthralgias, back pain and myalgias.  Neurological: Negative for dizziness, light-headedness and headaches.     Physical Exam Triage Vital Signs ED Triage Vitals  Enc Vitals Group     BP 03/22/21 1036 (!) 104/57     Pulse Rate 03/22/21 1036 69     Resp --      Temp 03/22/21 1036 98.8 F (37.1 C)     Temp Source 03/22/21 1036 Oral     SpO2 03/22/21 1036 100 %     Weight --      Height --      Head Circumference --      Peak Flow --      Pain Score 03/22/21 1038 8     Pain Loc --      Pain Edu? --      Excl. in GC? --    No data found.  Updated Vital Signs BP (!) 104/57 (BP Location: Left Arm)   Pulse 69   Temp 98.8 F (37.1 C) (Oral)   LMP 03/02/2021   SpO2 100%   Visual Acuity Right Eye Distance:   Left Eye Distance:   Bilateral Distance:    Right Eye Near:   Left Eye Near:    Bilateral Near:     Physical Exam Vitals reviewed.  Constitutional:      General: She is awake. She is not in acute distress.    Appearance: Normal appearance. She is normal weight. She is not ill-appearing.     Comments: Very pleasant female appears stated age in no acute distress  HENT:     Head: Normocephalic and atraumatic.     Mouth/Throat:     Pharynx: Uvula midline. No oropharyngeal exudate or posterior oropharyngeal erythema.  Cardiovascular:     Rate and Rhythm: Normal rate and regular rhythm.     Heart sounds: Normal heart sounds. No murmur heard.   Pulmonary:     Effort: Pulmonary effort is normal.     Breath sounds: Normal breath sounds. No wheezing, rhonchi or rales.     Comments: Clear to auscultation bilaterally Abdominal:     General: Bowel sounds are normal.     Palpations: Abdomen is soft.     Tenderness: There is no abdominal tenderness. There is no right CVA tenderness, left CVA tenderness, guarding or rebound.     Comments: Benign abdominal exam  Genitourinary:    Labia:        Right: No rash or tenderness.        Left:  No rash or tenderness.      Vagina: Vaginal discharge present. No erythema.     Cervix: No cervical motion tenderness or discharge.     Uterus: Normal.      Adnexa: Right adnexa normal and left adnexa normal.     Comments: Significant thin clear/white discharge noted posterior vaginal wall.  No cervical motion tenderness or adnexal tenderness on exam. Musculoskeletal:     Cervical back: Normal range of motion and neck supple.  Psychiatric:        Behavior: Behavior is cooperative.      UC Treatments / Results  Labs (all labs ordered are listed, but only abnormal results are displayed) Labs Reviewed  POCT URINALYSIS DIPSTICK, ED / UC  POC URINE PREG, ED  CERVICOVAGINAL ANCILLARY ONLY    EKG   Radiology No results found.  Procedures Procedures (including critical care time)  Medications Ordered in UC Medications - No data to display  Initial Impression / Assessment and Plan / UC Course  I have reviewed the triage vital signs and the nursing notes.  Pertinent labs & imaging results that were available during my care of the patient were reviewed by me and considered in my medical decision making (see chart for details).     Vital signs and physical exam reassuring today; no indication for emergent evaluation or imaging.  UA was negative for signs of infection.  Urine pregnancy was negative.  Will empirically treat for bacterial vaginosis with metronidazole patient was instructed not to drink any alcohol while taking this medication due to Antabuse side effects.  STI swab obtained today-results pending.  Discussed that we will contact her to arrange treatment if necessary based on lab results.  She was encouraged to use hypoallergenic soaps and detergents and wear loosefitting cotton underwear.  Discussed alarm symptoms that warrant emergent evaluation.  Strict return precautions given to which patient expressed understanding.  Final Clinical Impressions(s) / UC Diagnoses    Final diagnoses:  Lower abdominal pain  Vaginal discharge  Bacterial vaginosis     Discharge Instructions     We are going to treat you for bacterial vaginosis.  Please take metronidazole 500 mg twice daily for 7 days.  You should not drink any alcohol while taking this medication and for 72 hours after completing course as it will cause you to throw up.  Please use hypoallergenic soaps and detergents.  Wear loosefitting cotton underwear.  We will contact you with your swab results if we need to arrange any additional treatment.  Please follow-up with PCP if symptoms persist or worsen.    ED Prescriptions    Medication Sig Dispense Auth. Provider   metroNIDAZOLE (FLAGYL) 500 MG tablet Take 1 tablet (500 mg total) by mouth 2 (two) times daily. 14 tablet Ericson Nafziger, Noberto Retort, PA-C     PDMP not reviewed this encounter.   Jeani Hawking, PA-C 03/22/21 1110

## 2021-03-22 NOTE — Discharge Instructions (Addendum)
We are going to treat you for bacterial vaginosis.  Please take metronidazole 500 mg twice daily for 7 days.  You should not drink any alcohol while taking this medication and for 72 hours after completing course as it will cause you to throw up.  Please use hypoallergenic soaps and detergents.  Wear loosefitting cotton underwear.  We will contact you with your swab results if we need to arrange any additional treatment.  Please follow-up with PCP if symptoms persist or worsen.

## 2021-03-23 LAB — CERVICOVAGINAL ANCILLARY ONLY
Bacterial Vaginitis (gardnerella): POSITIVE — AB
Candida Glabrata: NEGATIVE
Candida Vaginitis: NEGATIVE
Chlamydia: NEGATIVE
Comment: NEGATIVE
Comment: NEGATIVE
Comment: NEGATIVE
Comment: NEGATIVE
Comment: NEGATIVE
Comment: NORMAL
Neisseria Gonorrhea: NEGATIVE
Trichomonas: NEGATIVE

## 2021-04-10 ENCOUNTER — Ambulatory Visit (HOSPITAL_COMMUNITY)
Admission: EM | Admit: 2021-04-10 | Discharge: 2021-04-10 | Disposition: A | Discharge status: Home / Self Care | Payer: Medicaid Other | Attending: Student | Admitting: Student

## 2021-04-10 ENCOUNTER — Encounter (HOSPITAL_COMMUNITY): Payer: Self-pay | Admitting: Emergency Medicine

## 2021-04-10 ENCOUNTER — Other Ambulatory Visit: Payer: Self-pay

## 2021-04-10 DIAGNOSIS — B3731 Acute candidiasis of vulva and vagina: Secondary | ICD-10-CM

## 2021-04-10 DIAGNOSIS — N760 Acute vaginitis: Secondary | ICD-10-CM | POA: Insufficient documentation

## 2021-04-10 DIAGNOSIS — B9689 Other specified bacterial agents as the cause of diseases classified elsewhere: Secondary | ICD-10-CM | POA: Insufficient documentation

## 2021-04-10 DIAGNOSIS — B373 Candidiasis of vulva and vagina: Secondary | ICD-10-CM

## 2021-04-10 MED ORDER — METRONIDAZOLE 500 MG PO TABS: 500.0000 mg | ORAL_TABLET | Freq: Two times a day (BID) | ORAL | 0 refills | Status: DC

## 2021-04-10 MED ORDER — FLUCONAZOLE 150 MG PO TABS: 150.0000 mg | ORAL_TABLET | Freq: Every day | ORAL | 0 refills | Status: AC

## 2021-04-10 NOTE — ED Triage Notes (Signed)
Patient presents to [facility] for evaluation of vaginal discharge/itchiness. States she finished metronidazole and things were resolving, but itching has worsened.

## 2021-04-10 NOTE — ED Provider Notes (Signed)
[facility]    CSN: [id number] Arrival date & time: 04/10/21  1715      History   Chief Complaint Chief Complaint  Patient presents with   Vaginal Itching    HPI Suzanne Lamb is a 30 y.o. female presenting with vaginal itching and discharge. Medical history chlamydia, gonorrhea, recent BV.  Was last seen at our urgent care on 5/26 and was diagnosed for bacterial vaginosis at that time.  She was treated with Flagyl with resolution of her symptoms, but endorses 3 days of recurrent vaginal discharge and itching. Thin white and clear vaginal discharge. Occ crampy lower abd pain. External vaginal itching and irritation.  Denies new partners or intercourse since her last screening with [place], states she is not pregnant. Denies hematuria, dysuria, frequency, urgency, back pain, n/v/d, fevers/chills, abdnormal vaginal rashes or lesions, new partners.  HPI  Past Medical History:  Diagnosis Date   Anemia    Bell's palsy 2014   Depression    Hx of chlamydia infection    Hx of gonorrhea     There are no problems to display for this patient.   Past Surgical History:  Procedure Laterality Date   NO PAST SURGERIES      OB History     Gravida  2   Para  2   Term  2   Preterm      AB      Living  2      SAB      IAB      Ectopic      Multiple  0   Live Births  2            Home Medications    Prior to Admission medications   Medication Sig Start Date End Date Taking? Authorizing Provider  fluconazole (DIFLUCAN) 150 MG tablet Take 1 tablet (150 mg total) by mouth daily for 2 days. -For your yeast infection, start the Diflucan (fluconazole)- Take one pill today (day 1). If you're still having symptoms in 3 days, take the second pill. 04/10/21 04/12/21 Yes Rhys Martini, PA-C  metroNIDAZOLE (FLAGYL) 500 MG tablet Take 1 tablet (500 mg total) by mouth 2 (two) times daily. 04/10/21   Rhys Martini, PA-C  norelgestromin-ethinyl estradiol [facility]) 150-35  MCG/24HR transdermal patch Place 1 patch onto the skin once a week. 10/09/20   Grayce Sessions, NP    Family History Family History  Problem Relation Age of Onset   Diabetes Mother    Hypertension Mother    Kidney disease Mother    Diabetes Sister    Diabetes Maternal Grandmother    Kidney disease Maternal Grandmother    Alcohol abuse Neg Hx    Arthritis Neg Hx    Asthma Neg Hx    Birth defects Neg Hx    Cancer Neg Hx    COPD Neg Hx    Depression Neg Hx    Drug abuse Neg Hx    Early death Neg Hx    Hearing loss Neg Hx    Heart disease Neg Hx    Hyperlipidemia Neg Hx    Learning disabilities Neg Hx    Mental illness Neg Hx    Mental retardation Neg Hx    Miscarriages / Stillbirths Neg Hx    Stroke Neg Hx    Vision loss Neg Hx    Colon cancer Neg Hx    Liver cancer Neg Hx    Stomach cancer Neg  Hx    Esophageal cancer Neg Hx    Pancreatic cancer Neg Hx     Social History Social History   Tobacco Use   Smoking status: Never   Smokeless tobacco: Never  Vaping Use   Vaping Use: Never used  Substance Use Topics   Alcohol use: No   Drug use: Not Currently    Types: Marijuana    Comment: stopped with pos UPT-did not start back     Allergies   Patient has no known allergies.   Review of Systems Review of Systems  Constitutional:  Negative for chills and fever.  HENT:  Negative for sore throat.   Eyes:  Negative for pain and redness.  Respiratory:  Negative for shortness of breath.   Cardiovascular:  Negative for chest pain.  Gastrointestinal:  Positive for abdominal pain. Negative for diarrhea, nausea and vomiting.  Genitourinary:  Positive for vaginal discharge. Negative for decreased urine volume, difficulty urinating, dysuria, flank pain, frequency, genital sores, hematuria, menstrual problem, pelvic pain, urgency, vaginal bleeding and vaginal pain.  Musculoskeletal:  Negative for back pain.  Skin:  Negative for rash.  All other systems reviewed and  are negative.   Physical Exam Triage Vital Signs ED Triage Vitals  Enc Vitals Group     BP 04/10/21 1816 112/64     Pulse Rate 04/10/21 1816 90     Resp 04/10/21 1816 18     Temp 04/10/21 1816 98 F (36.7 C)     Temp Source 04/10/21 1816 Oral     SpO2 04/10/21 1816 100 %     Weight --      Height --      Head Circumference --      Peak Flow --      Pain Score 04/10/21 1818 0     Pain Loc --      Pain Edu? --      Excl. in GC? --    No data found.  Updated Vital Signs BP 112/64 (BP Location: Right Arm)   Pulse 90   Temp 98 F (36.7 C) (Oral)   Resp 18   LMP 03/28/2021   SpO2 100%   Visual Acuity Right Eye Distance:   Left Eye Distance:   Bilateral Distance:    Right Eye Near:   Left Eye Near:    Bilateral Near:     Physical Exam Vitals reviewed.  Constitutional:      General: She is not in acute distress.    Appearance: Normal appearance. She is not ill-appearing.  HENT:     Head: Normocephalic and atraumatic.     Mouth/Throat:     Mouth: Mucous membranes are moist.     Comments: Moist mucous membranes Eyes:     Extraocular Movements: Extraocular movements intact.     Pupils: Pupils are equal, round, and reactive to light.  Cardiovascular:     Rate and Rhythm: Normal rate and regular rhythm.     Heart sounds: Normal heart sounds.  Pulmonary:     Effort: Pulmonary effort is normal.     Breath sounds: Normal breath sounds. No wheezing, rhonchi or rales.  Abdominal:     General: Bowel sounds are normal. There is no distension.     Palpations: Abdomen is soft. There is no mass.     Tenderness: There is no abdominal tenderness. There is no right CVA tenderness, left CVA tenderness, guarding or rebound.  Genitourinary:    Comments: deferred Skin:  General: Skin is warm.     Capillary Refill: Capillary refill takes less than 2 seconds.     Comments: Good skin turgor  Neurological:     General: No focal deficit present.     Mental Status: She is  alert and oriented to person, place, and time.  Psychiatric:        Mood and Affect: Mood normal.        Behavior: Behavior normal.     UC Treatments / Results  Labs (all labs ordered are listed, but only abnormal results are displayed) Labs Reviewed - No data to display  EKG   Radiology No results found.  Procedures Procedures (including critical care time)  Medications Ordered in UC Medications - No data to display  Initial Impression / Assessment and Plan / UC Course  I have reviewed the triage vital signs and the nursing notes.  Pertinent labs & imaging results that were available during my care of the patient were reviewed by me and considered in my medical decision making (see chart for details).     This patient is a 30 year old female presenting with recurrent vaginitis.  Afebrile, nontachycardic, no abdominal pain or CVAT. States she is not pregnant. Denies STI risk.  Self swab sent for BV, yeast, gonorrhea, chlamydia, trichomonas.  Safe sex precautions.  We will treat with Flagyl and Diflucan as below, also recommended over-the-counter Monistat or boric acid for additional symptomatic relief.  Given recurrent nature of symptoms, strongly advised following up with a GYN.  Patient states she has one in mind.  ED return precautions discussed. Patient verbalizes understanding and agreement.   Coding this visit a Level 4 for review of past notes and labs (02/2021), ordering of labs, prescription drug management.  Final Clinical Impressions(s) / UC Diagnoses   Final diagnoses:  Vaginal candida  Bacterial vaginitis  Recurrent vaginitis  BV (bacterial vaginosis)     Discharge Instructions      -For your bacterial vaginosis, Flagyl (metronidazole) twice daily for 7 days.  If you have a sensitive stomach, you can take this medication with food.  Avoid alcohol for the next 9 days while you are on this medication, as it will cause severe nausea and vomiting while  you take it and for 2 days after. -For your yeast infection, start the Diflucan (fluconazole)- Take one pill today (day 1). If you're still having symptoms in 3 days, take the second pill.  -Try picking up some over-the-counter Monistat cream or suppositories, or boric acid suppositories for additional relief. -Try to establish care with an OB/GYN for further evaluation and management, they can further work-up your recurrent bacterial vaginosis. -Seek additional immediate medical attention if you develop new symptoms like severe abdominal pain, back pain, new fever/chills, worsening of the vaginal symptoms despite treatment.     ED Prescriptions     Medication Sig Dispense Auth. Provider   metroNIDAZOLE (FLAGYL) 500 MG tablet Take 1 tablet (500 mg total) by mouth 2 (two) times daily. 14 tablet Rhys Martini, PA-C   fluconazole (DIFLUCAN) 150 MG tablet Take 1 tablet (150 mg total) by mouth daily for 2 days. -For your yeast infection, start the Diflucan (fluconazole)- Take one pill today (day 1). If you're still having symptoms in 3 days, take the second pill. 2 tablet Rhys Martini, PA-C      PDMP not reviewed this encounter.   Rhys Martini, PA-C 04/10/21 1918

## 2021-04-10 NOTE — Discharge Instructions (Addendum)
-  For your bacterial vaginosis, Flagyl (metronidazole) twice daily for 7 days.  If you have a sensitive stomach, you can take this medication with food.  Avoid alcohol for the next 9 days while you are on this medication, as it will cause severe nausea and vomiting while you take it and for 2 days after. -For your yeast infection, start the Diflucan (fluconazole)- Take one pill today (day 1). If you're still having symptoms in 3 days, take the second pill.  -Try picking up some over-the-counter Monistat cream or suppositories, or boric acid suppositories for additional relief. -Try to establish care with an OB/GYN for further evaluation and management, they can further work-up your recurrent bacterial vaginosis. -Seek additional immediate medical attention if you develop new symptoms like severe abdominal pain, back pain, new fever/chills, worsening of the vaginal symptoms despite treatment.

## 2021-04-11 LAB — CERVICOVAGINAL ANCILLARY ONLY
Bacterial Vaginitis (gardnerella): POSITIVE — AB
Candida Glabrata: NEGATIVE
Candida Vaginitis: POSITIVE — AB
Chlamydia: NEGATIVE
Comment: NEGATIVE
Comment: NEGATIVE
Comment: NEGATIVE
Comment: NEGATIVE
Comment: NEGATIVE
Comment: NORMAL
Neisseria Gonorrhea: NEGATIVE
Trichomonas: NEGATIVE

## 2021-08-20 ENCOUNTER — Ambulatory Visit
Admission: EM | Admit: 2021-08-20 | Discharge: 2021-08-20 | Disposition: A | Discharge status: Home / Self Care | Payer: Medicaid Other | Attending: Physician Assistant | Admitting: Physician Assistant

## 2021-08-20 ENCOUNTER — Encounter: Payer: Self-pay | Admitting: Emergency Medicine

## 2021-08-20 ENCOUNTER — Other Ambulatory Visit: Payer: Self-pay

## 2021-08-20 DIAGNOSIS — N898 Other specified noninflammatory disorders of vagina: Secondary | ICD-10-CM | POA: Diagnosis not present

## 2021-08-20 NOTE — ED Provider Notes (Signed)
[name]    CSN: [id number] Arrival date & time: 08/20/21  1759      History   Chief Complaint Chief Complaint  Patient presents with   Exposure to STD    HPI Suzanne Lamb is a 30 y.o. female.   Patient here today for evaluation of vaginal discharge. She reports she often is diagnosed with BV. She would also like STD screening. She denies any STD exposure. She denies any fever, chills, nausea and vomiting.   The history is provided by the patient.   Past Medical History:  Diagnosis Date   Anemia    Bell's palsy 2014   Depression    Hx of chlamydia infection    Hx of gonorrhea     There are no problems to display for this patient.   Past Surgical History:  Procedure Laterality Date   NO PAST SURGERIES      OB History     Gravida  2   Para  2   Term  2   Preterm      AB      Living  2      SAB      IAB      Ectopic      Multiple  0   Live Births  2            Home Medications    Prior to Admission medications   Medication Sig Start Date End Date Taking? Authorizing Provider  metroNIDAZOLE (FLAGYL) 500 MG tablet Take 1 tablet (500 mg total) by mouth 2 (two) times daily. 04/10/21   Rhys Martini, PA-C  norelgestromin-ethinyl estradiol [facility]) 150-35 MCG/24HR transdermal patch Place 1 patch onto the skin once a week. 10/09/20   Grayce Sessions, NP    Family History Family History  Problem Relation Age of Onset   Diabetes Mother    Hypertension Mother    Kidney disease Mother    Diabetes Sister    Diabetes Maternal Grandmother    Kidney disease Maternal Grandmother    Alcohol abuse Neg Hx    Arthritis Neg Hx    Asthma Neg Hx    Birth defects Neg Hx    Cancer Neg Hx    COPD Neg Hx    Depression Neg Hx    Drug abuse Neg Hx    Early death Neg Hx    Hearing loss Neg Hx    Heart disease Neg Hx    Hyperlipidemia Neg Hx    Learning disabilities Neg Hx    Mental illness Neg Hx    Mental retardation Neg  Hx    Miscarriages / Stillbirths Neg Hx    Stroke Neg Hx    Vision loss Neg Hx    Colon cancer Neg Hx    Liver cancer Neg Hx    Stomach cancer Neg Hx    Esophageal cancer Neg Hx    Pancreatic cancer Neg Hx     Social History Social History   Tobacco Use   Smoking status: Never   Smokeless tobacco: Never  Vaping Use   Vaping Use: Never used  Substance Use Topics   Alcohol use: No   Drug use: Not Currently    Types: Marijuana    Comment: stopped with pos UPT-did not start back     Allergies   Patient has no known allergies.   Review of Systems Review of Systems  Constitutional:  Negative for chills and fever.  Eyes:  Negative for discharge and redness.  Respiratory:  Negative for shortness of breath.   Gastrointestinal:  Negative for abdominal pain, nausea and vomiting.  Genitourinary:  Positive for vaginal bleeding and vaginal discharge.  Musculoskeletal:  Negative for back pain.    Physical Exam Triage Vital Signs ED Triage Vitals  Enc Vitals Group     BP      Pulse      Resp      Temp      Temp src      SpO2      Weight      Height      Head Circumference      Peak Flow      Pain Score      Pain Loc      Pain Edu?      Excl. in GC?    No data found.  Updated Vital Signs BP 99/63 (BP Location: Left Arm)   Pulse (!) 105   Temp 98.5 F (36.9 C) (Oral)   Ht 5\' 10"  (1.778 m)   LMP 07/28/2021   SpO2 98%   BMI 22.93 kg/m   Physical Exam Vitals and nursing note reviewed.  Constitutional:      General: She is not in acute distress.    Appearance: Normal appearance. She is not ill-appearing.  HENT:     Head: Normocephalic and atraumatic.  Eyes:     Conjunctiva/sclera: Conjunctivae normal.  Cardiovascular:     Rate and Rhythm: Normal rate.  Pulmonary:     Effort: Pulmonary effort is normal.  Neurological:     Mental Status: She is alert.  Psychiatric:        Mood and Affect: Mood normal.        Behavior: Behavior normal.        Thought  Content: Thought content normal.     UC Treatments / Results  Labs (all labs ordered are listed, but only abnormal results are displayed) Labs Reviewed  CERVICOVAGINAL ANCILLARY ONLY    EKG   Radiology No results found.  Procedures Procedures (including critical care time)  Medications Ordered in UC Medications - No data to display  Initial Impression / Assessment and Plan / UC Course  I have reviewed the triage vital signs and the nursing notes.  Pertinent labs & imaging results that were available during my care of the patient were reviewed by me and considered in my medical decision making (see chart for details).   STD screening ordered. Recommended follow up with any further concerns.   Final Clinical Impressions(s) / UC Diagnoses   Final diagnoses:  Vaginal discharge   Discharge Instructions   None    ED Prescriptions   None    PDMP not reviewed this encounter.   09/27/2021, PA-C 08/20/21 1929

## 2021-08-20 NOTE — ED Triage Notes (Signed)
Patient c/o possible BV, discharge x 2 days, no odor.  Requesting STD testing.

## 2021-08-22 ENCOUNTER — Telehealth: Payer: Self-pay

## 2021-08-22 LAB — CERVICOVAGINAL ANCILLARY ONLY
Bacterial Vaginitis (gardnerella): POSITIVE — AB
Candida Glabrata: NEGATIVE
Candida Vaginitis: NEGATIVE
Chlamydia: NEGATIVE
Comment: NEGATIVE
Comment: NEGATIVE
Comment: NEGATIVE
Comment: NEGATIVE
Comment: NEGATIVE
Comment: NORMAL
Neisseria Gonorrhea: NEGATIVE
Trichomonas: NEGATIVE

## 2021-08-22 MED ORDER — METRONIDAZOLE 500 MG PO TABS
500.0000 mg | ORAL_TABLET | Freq: Two times a day (BID) | ORAL | 0 refills | Status: DC
Start: 2021-08-22 — End: 2021-09-18

## 2021-09-18 ENCOUNTER — Ambulatory Visit
Admission: EM | Admit: 2021-09-18 | Discharge: 2021-09-18 | Disposition: A | Discharge status: Home / Self Care | Payer: Medicaid Other | Attending: Physician Assistant | Admitting: Physician Assistant

## 2021-09-18 ENCOUNTER — Other Ambulatory Visit: Payer: Self-pay

## 2021-09-18 DIAGNOSIS — N760 Acute vaginitis: Secondary | ICD-10-CM | POA: Diagnosis not present

## 2021-09-18 LAB — POCT URINE PREGNANCY: Preg Test, Ur: NEGATIVE

## 2021-09-18 MED ORDER — FLUCONAZOLE 150 MG PO TABS: 150.0000 mg | ORAL_TABLET | Freq: Every day | ORAL | 0 refills | Status: DC

## 2021-09-18 NOTE — ED Triage Notes (Addendum)
Pt c/o yellow discharge with an odor since yesterday. States had un protective intercourse 2wks ago. States hx of chronic BV but this is different and having vaginal pain.

## 2021-09-18 NOTE — ED Provider Notes (Signed)
[name]    CSN: [id number] Arrival date & time: 09/18/21  1238      History   Chief Complaint Chief Complaint  Patient presents with   SEXUALLY TRANSMITTED DISEASE    HPI Suzanne Lamb is a 30 y.o. female.   Patient here today for evaluation of yellowish discharge, and vaginal itching and burning that started a few days ago.  She reports that she typically gets BV but the symptoms seem different.  She did have unprotected sex 2 weeks ago.  She cannot recall her last period.  The history is provided by the patient.   Past Medical History:  Diagnosis Date   Anemia    Bell's palsy 2014   Depression    Hx of chlamydia infection    Hx of gonorrhea     There are no problems to display for this patient.   Past Surgical History:  Procedure Laterality Date   NO PAST SURGERIES      OB History     Gravida  2   Para  2   Term  2   Preterm      AB      Living  2      SAB      IAB      Ectopic      Multiple  0   Live Births  2            Home Medications    Prior to Admission medications   Medication Sig Start Date End Date Taking? Authorizing Provider  fluconazole (DIFLUCAN) 150 MG tablet Take 1 tablet (150 mg total) by mouth daily. 09/18/21  Yes Tomi Bamberger, PA-C    Family History Family History  Problem Relation Age of Onset   Diabetes Mother    Hypertension Mother    Kidney disease Mother    Diabetes Sister    Diabetes Maternal Grandmother    Kidney disease Maternal Grandmother    Alcohol abuse Neg Hx    Arthritis Neg Hx    Asthma Neg Hx    Birth defects Neg Hx    Cancer Neg Hx    COPD Neg Hx    Depression Neg Hx    Drug abuse Neg Hx    Early death Neg Hx    Hearing loss Neg Hx    Heart disease Neg Hx    Hyperlipidemia Neg Hx    Learning disabilities Neg Hx    Mental illness Neg Hx    Mental retardation Neg Hx    Miscarriages / Stillbirths Neg Hx    Stroke Neg Hx    Vision loss Neg Hx    Colon  cancer Neg Hx    Liver cancer Neg Hx    Stomach cancer Neg Hx    Esophageal cancer Neg Hx    Pancreatic cancer Neg Hx     Social History Social History   Tobacco Use   Smoking status: Never   Smokeless tobacco: Never  Vaping Use   Vaping Use: Never used  Substance Use Topics   Alcohol use: No   Drug use: Not Currently    Types: Marijuana    Comment: stopped with pos UPT-did not start back     Allergies   Patient has no known allergies.   Review of Systems Review of Systems  Constitutional:  Negative for chills and fever.  Eyes:  Negative for discharge and redness.  Respiratory:  Negative for shortness of breath.  Gastrointestinal:  Negative for abdominal pain, nausea and vomiting.  Genitourinary:  Positive for vaginal bleeding, vaginal discharge and vaginal pain. Negative for dysuria.    Physical Exam Triage Vital Signs ED Triage Vitals  Enc Vitals Group     BP 09/18/21 1339 114/80     Pulse Rate 09/18/21 1339 82     Resp 09/18/21 1339 18     Temp 09/18/21 1339 98.1 F (36.7 C)     Temp Source 09/18/21 1339 Oral     SpO2 09/18/21 1339 98 %     Weight --      Height --      Head Circumference --      Peak Flow --      Pain Score 09/18/21 1340 5     Pain Loc --      Pain Edu? --      Excl. in GC? --    No data found.  Updated Vital Signs BP 114/80 (BP Location: Left Arm)   Pulse 82   Temp 98.1 F (36.7 C) (Oral)   Resp 18   LMP  (LMP Unknown)   SpO2 98%     Physical Exam Vitals and nursing note reviewed.  Constitutional:      General: She is not in acute distress.    Appearance: Normal appearance. She is not ill-appearing.  HENT:     Head: Normocephalic and atraumatic.  Eyes:     Conjunctiva/sclera: Conjunctivae normal.  Cardiovascular:     Rate and Rhythm: Normal rate.  Pulmonary:     Effort: Pulmonary effort is normal.  Neurological:     Mental Status: She is alert.  Psychiatric:        Mood and Affect: Mood normal.         Behavior: Behavior normal.        Thought Content: Thought content normal.     UC Treatments / Results  Labs (all labs ordered are listed, but only abnormal results are displayed) Labs Reviewed  POCT URINE PREGNANCY  CERVICOVAGINAL ANCILLARY ONLY    EKG   Radiology No results found.  Procedures Procedures (including critical care time)  Medications Ordered in UC Medications - No data to display  Initial Impression / Assessment and Plan / UC Course  I have reviewed the triage vital signs and the nursing notes.  Pertinent labs & imaging results that were available during my care of the patient were reviewed by me and considered in my medical decision making (see chart for details).  STD screening ordered.  Diflucan prescribed given reports of itching and burning.  Will await results for further recommendation for treatment.  Final Clinical Impressions(s) / UC Diagnoses   Final diagnoses:  Acute vaginitis   Discharge Instructions   None    ED Prescriptions     Medication Sig Dispense Auth. Provider   fluconazole (DIFLUCAN) 150 MG tablet Take 1 tablet (150 mg total) by mouth daily. 1 tablet Tomi Bamberger, PA-C      PDMP not reviewed this encounter.   Tomi Bamberger, PA-C 09/18/21 1434

## 2021-09-19 ENCOUNTER — Telehealth (HOSPITAL_COMMUNITY): Payer: Self-pay | Admitting: Emergency Medicine

## 2021-09-19 LAB — CERVICOVAGINAL ANCILLARY ONLY
Bacterial Vaginitis (gardnerella): POSITIVE — AB
Candida Glabrata: NEGATIVE
Candida Vaginitis: POSITIVE — AB
Chlamydia: NEGATIVE
Comment: NEGATIVE
Comment: NEGATIVE
Comment: NEGATIVE
Comment: NEGATIVE
Comment: NEGATIVE
Comment: NORMAL
Neisseria Gonorrhea: NEGATIVE
Trichomonas: NEGATIVE

## 2021-09-19 MED ORDER — METRONIDAZOLE 500 MG PO TABS: 500.0000 mg | ORAL_TABLET | Freq: Two times a day (BID) | ORAL | 0 refills | Status: DC

## 2023-05-09 ENCOUNTER — Other Ambulatory Visit: Payer: Self-pay

## 2023-05-09 ENCOUNTER — Ambulatory Visit
Admission: EM | Admit: 2023-05-09 | Discharge: 2023-05-09 | Disposition: A | Discharge status: Home / Self Care | Payer: Medicaid Other | Attending: Family Medicine | Admitting: Family Medicine

## 2023-05-09 ENCOUNTER — Encounter: Payer: Self-pay | Admitting: *Deleted

## 2023-05-09 DIAGNOSIS — Z113 Encounter for screening for infections with a predominantly sexual mode of transmission: Secondary | ICD-10-CM

## 2023-05-09 DIAGNOSIS — Z3A01 Less than 8 weeks gestation of pregnancy: Secondary | ICD-10-CM

## 2023-05-09 DIAGNOSIS — R1110 Vomiting, unspecified: Secondary | ICD-10-CM

## 2023-05-09 LAB — POCT URINALYSIS DIP (MANUAL ENTRY)
Glucose, UA: NEGATIVE mg/dL
Leukocytes, UA: NEGATIVE
Nitrite, UA: NEGATIVE
Spec Grav, UA: >=1.03 — AB (ref 1.010–1.025)
Urobilinogen, UA: >=8 E.U./dL — AB
pH, UA: 6 (ref 5.0–8.0)

## 2023-05-09 LAB — POCT URINE PREGNANCY: Preg Test, Ur: POSITIVE — AB

## 2023-05-09 MED ORDER — PRENATAL COMPLETE 14-0.4 MG PO TABS: 1.0000 | ORAL_TABLET | Freq: Every day | ORAL | 0 refills | Status: AC

## 2023-05-09 MED ORDER — DIPHENHYDRAMINE HCL 25 MG PO TABS: 25.0000 mg | ORAL_TABLET | Freq: Four times a day (QID) | ORAL | 0 refills | Status: AC | PRN

## 2023-05-09 MED ORDER — METOCLOPRAMIDE HCL 5 MG/ML IJ SOLN
5.0000 mg | Freq: Once | INTRAMUSCULAR | Status: AC
  Administered 2023-05-09: 5 mg via INTRAMUSCULAR

## 2023-05-09 MED ORDER — DOXYLAMINE-PYRIDOXINE 10-10 MG PO TBEC: 1.0000 | DELAYED_RELEASE_TABLET | Freq: Two times a day (BID) | ORAL | 0 refills | Status: AC | PRN

## 2023-05-09 NOTE — Discharge Instructions (Addendum)
I prescribed 2 medications that you can take for nausea.  Both medications can cause severe drowsiness therefore take when you are not operating a motor vehicle. We have collected a vaginal cytology swab and your results from this test with will be available within 24 to 48 hours.  Our office will call you if any additional treatment needed. You had a positive pregnancy test today.  I have included information where you can establish prenatal care.

## 2023-05-09 NOTE — ED Triage Notes (Signed)
C/O starting with nausea intermittently 2 days ago; states "right after that I started to have shortness of breath". States then started with runny nose, nasal congestion, poor appetite, "harsh" transient abd cramping. Denies cough. Has been drinking ginger ale. During triage, pt's RR varies from 20-28.

## 2023-05-09 NOTE — ED Provider Notes (Signed)
[name]    CSN: [id number] Arrival date & time: 05/09/23  1126      History   Chief Complaint Chief Complaint  Patient presents with   Nausea   Shortness of Breath   Abdominal Pain    HPI Suzanne Lamb is a 32 y.o. female.   HPI Patient is checked in with a complaint of shortness of breath however upon being triaged it was learned that patient has had shortness of breath due to episodes of persistent nausea feeling fatigued along with generalized abdominal cramping.  After triage workup it was learned that patient has not had a menstrual period since the latter part of May.  Endorse the possibility of pregnancy as she is sexually active.  She reports an intolerance of food due to severe nausea all week.  She is currently not shortness of breath.  Denies any sick contacts.  Patient became very tearful as she thinks she is pregnant.  She is also concerned that she may have bacterial vaginosis and would like to be checked for vaginitis.  Denies any specific concerns for STDs. Past Medical History:  Diagnosis Date   Anemia    Bell's palsy 2014   Depression    Hx of chlamydia infection    Hx of gonorrhea     There are no problems to display for this patient.   Past Surgical History:  Procedure Laterality Date   NO PAST SURGERIES      OB History     Gravida  2   Para  2   Term  2   Preterm      AB      Living  2      SAB      IAB      Ectopic      Multiple  0   Live Births  2            Home Medications    Prior to Admission medications   Medication Sig Start Date End Date Taking? Authorizing Provider  diphenhydrAMINE (BENADRYL) 25 MG tablet Take 1 tablet (25 mg total) by mouth every 6 (six) hours as needed (Nausea and Vomiting in pregnancy). 05/09/23  Yes Bing Neighbors, NP  Doxylamine-Pyridoxine 10-10 MG TBEC Take 1 tablet by mouth 2 (two) times daily as needed (Nausea). 05/09/23  Yes Bing Neighbors, NP  Prenatal Vit-Fe  Fumarate-FA (PRENATAL COMPLETE) 14-0.4 MG TABS Take 1 tablet by mouth daily. 05/09/23  Yes Bing Neighbors, NP  fluconazole (DIFLUCAN) 150 MG tablet Take 1 tablet (150 mg total) by mouth daily. 09/18/21   Tomi Bamberger, PA-C  metroNIDAZOLE (FLAGYL) 500 MG tablet Take 1 tablet (500 mg total) by mouth 2 (two) times daily. 09/19/21   LampteyBritta Mccreedy, MD    Family History Family History  Problem Relation Age of Onset   Diabetes Mother    Hypertension Mother    Kidney disease Mother    Diabetes Sister    Diabetes Maternal Grandmother    Kidney disease Maternal Grandmother    Alcohol abuse Neg Hx    Arthritis Neg Hx    Asthma Neg Hx    Birth defects Neg Hx    Cancer Neg Hx    COPD Neg Hx    Depression Neg Hx    Drug abuse Neg Hx    Early death Neg Hx    Hearing loss Neg Hx    Heart disease Neg Hx  Hyperlipidemia Neg Hx    Learning disabilities Neg Hx    Mental illness Neg Hx    Mental retardation Neg Hx    Miscarriages / Stillbirths Neg Hx    Stroke Neg Hx    Vision loss Neg Hx    Colon cancer Neg Hx    Liver cancer Neg Hx    Stomach cancer Neg Hx    Esophageal cancer Neg Hx    Pancreatic cancer Neg Hx     Social History Social History   Tobacco Use   Smoking status: Never   Smokeless tobacco: Never  Vaping Use   Vaping status: Never Used  Substance Use Topics   Alcohol use: No   Drug use: Not Currently    Types: Marijuana    Comment: stopped with pos UPT-did not start back     Allergies   Shrimp (diagnostic)   Review of Systems Review of Systems Pertinent negatives listed in HPI  Physical Exam Triage Vital Signs ED Triage Vitals  Encounter Vitals Group     BP 05/09/23 1140 117/79     Systolic BP Percentile --      Diastolic BP Percentile --      Pulse Rate 05/09/23 1133 (!) 118     Resp 05/09/23 1133 (!) 32     Temp 05/09/23 1140 97.7 F (36.5 C)     Temp src --      SpO2 05/09/23 1133 96 %     Weight --      Height --      Head  Circumference --      Peak Flow --      Pain Score 05/09/23 1142 0     Pain Loc --      Pain Education --      Exclude from Growth Chart --    No data found.  Updated Vital Signs BP 117/79   Pulse 100   Temp 97.7 F (36.5 C)   Resp (!) 22   LMP 03/26/2023 (Approximate)   SpO2 99%   Visual Acuity Right Eye Distance:   Left Eye Distance:   Bilateral Distance:    Right Eye Near:   Left Eye Near:    Bilateral Near:     Physical Exam Constitutional:      Appearance: She is well-developed.  HENT:     Head: Normocephalic and atraumatic.  Eyes:     Extraocular Movements: Extraocular movements intact.     Pupils: Pupils are equal, round, and reactive to light.  Cardiovascular:     Rate and Rhythm: Normal rate and regular rhythm.  Pulmonary:     Effort: Pulmonary effort is normal. No respiratory distress.     Breath sounds: Normal breath sounds. No wheezing, rhonchi or rales.  Musculoskeletal:     Cervical back: Normal range of motion.  Skin:    General: Skin is warm.  Neurological:     General: No focal deficit present.     Mental Status: She is alert.  Psychiatric:        Attention and Perception: Attention normal.        Mood and Affect: Mood is anxious. Affect is tearful.        Behavior: Behavior is cooperative.        Judgment: Judgment normal.      UC Treatments / Results  Labs (all labs ordered are listed, but only abnormal results are displayed) Labs Reviewed  POCT URINALYSIS DIP (MANUAL ENTRY) - Abnormal;  Notable for the following components:      Result Value   Clarity, UA hazy (*)    Bilirubin, UA small (*)    Ketones, POC UA small (15) (*)    Spec Grav, UA >=1.030 (*)    Blood, UA trace-intact (*)    Protein Ur, POC trace (*)    Urobilinogen, UA >=8.0 (*)    All other components within normal limits  POCT URINE PREGNANCY - Abnormal; Notable for the following components:   Preg Test, Ur Positive (*)    All other components within normal limits   CERVICOVAGINAL ANCILLARY ONLY    EKG   Radiology No results found.  Procedures Procedures (including critical care time)  Medications Ordered in UC Medications  metoCLOPramide (REGLAN) injection 5 mg (5 mg Intramuscular Given 05/09/23 1227)    Initial Impression / Assessment and Plan / UC Course  I have reviewed the triage vital signs and the nursing notes.  Pertinent labs & imaging results that were available during my care of the patient were reviewed by me and considered in my medical decision making (see chart for details).    Urine hCG is positive and based on estimated last menstrual period date patient is 6 to [redacted] weeks pregnant.  She is very tearful learning of pregnancy.  Parents provided information provided for resources to establish with an OB/GYN.  For hyperemesis patient prescribed doxylamine-pyridoxine and diphenhydramine.  Start prenatal vitamins. Also provided 1 dose of Reglan IM due to hyperemesis.  Patient tolerated medication.  Return precautions given.  Patient verbalized understanding and agreement with plan. Final Clinical Impressions(s) / UC Diagnoses   Final diagnoses:  Screen for STD (sexually transmitted disease)  Less than [redacted] weeks gestation of pregnancy  Hyperemesis     Discharge Instructions      I prescribed 2 medications that you can take for nausea.  Both medications can cause severe drowsiness therefore take when you are not operating a motor vehicle. We have collected a vaginal cytology swab and your results from this test with will be available within 24 to 48 hours.  Our office will call you if any additional treatment needed. You had a positive pregnancy test today.  I have included information where you can establish prenatal care.     ED Prescriptions     Medication Sig Dispense Auth. Provider   Doxylamine-Pyridoxine 10-10 MG TBEC Take 1 tablet by mouth 2 (two) times daily as needed (Nausea). 60 tablet Bing Neighbors, NP    Prenatal Vit-Fe Fumarate-FA (PRENATAL COMPLETE) 14-0.4 MG TABS Take 1 tablet by mouth daily. 270 tablet Bing Neighbors, NP   diphenhydrAMINE (BENADRYL) 25 MG tablet Take 1 tablet (25 mg total) by mouth every 6 (six) hours as needed (Nausea and Vomiting in pregnancy). 30 tablet Bing Neighbors, NP      PDMP not reviewed this encounter.   Bing Neighbors, NP 05/09/23 [id number]

## 2023-05-09 NOTE — ED Triage Notes (Signed)
Pt assessed while checking in -- C/O SOB without cough x 2-3 days. Denies hx asthma.

## 2023-05-12 ENCOUNTER — Telehealth: Payer: Self-pay

## 2023-05-12 LAB — CERVICOVAGINAL ANCILLARY ONLY
Bacterial Vaginitis (gardnerella): POSITIVE — AB
Candida Glabrata: NEGATIVE
Candida Vaginitis: NEGATIVE
Chlamydia: NEGATIVE
Comment: NEGATIVE
Comment: NEGATIVE
Comment: NEGATIVE
Comment: NEGATIVE
Comment: NEGATIVE
Comment: NORMAL
Neisseria Gonorrhea: NEGATIVE
Trichomonas: NEGATIVE

## 2023-05-12 MED ORDER — METRONIDAZOLE 0.75 % VA GEL: 1.0000 | Freq: Every day | VAGINAL | Status: DC

## 2023-05-12 NOTE — Telephone Encounter (Signed)
Per protocol, pt requires tx with metrogel. Reviewed with patient, verified pharmacy, prescription sent

## 2023-05-13 ENCOUNTER — Ambulatory Visit: Payer: Medicaid Other

## 2023-05-16 ENCOUNTER — Telehealth: Payer: Self-pay

## 2023-05-16 MED ORDER — METRONIDAZOLE 0.75 % VA GEL: 1.0000 | Freq: Two times a day (BID) | VAGINAL | 0 refills | Status: AC

## 2023-05-16 NOTE — Telephone Encounter (Signed)
Metrogel sent to the pharmacy today. When it was supposed to be sent on 7/15, it was done as a clinic administered order, so did not go as a rx to the pharmacy

## 2023-06-05 ENCOUNTER — Telehealth: Payer: Medicaid Other

## 2023-06-05 DIAGNOSIS — Z3490 Encounter for supervision of normal pregnancy, unspecified, unspecified trimester: Secondary | ICD-10-CM

## 2023-06-05 NOTE — Progress Notes (Signed)
Left message on voicemail at 1020 requesting pt to return call.  I will try again in 10 minutes in which if I do not reach you I will have to ask that you reschedule your appt.    Second attempt made.  Patient will have to reschedule.    Leonette Nutting  06/05/23

## 2023-06-16 ENCOUNTER — Encounter: Payer: Medicaid Other | Admitting: Family Medicine

## 2023-06-16 ENCOUNTER — Encounter: Payer: Self-pay | Admitting: Family Medicine

## 2023-07-05 ENCOUNTER — Inpatient Hospital Stay (HOSPITAL_COMMUNITY)
Admission: AD | Admit: 2023-07-05 | Discharge: 2023-07-05 | Disposition: A | Discharge status: Home / Self Care | Payer: Medicaid Other | Attending: Obstetrics & Gynecology | Admitting: Obstetrics & Gynecology

## 2023-07-05 ENCOUNTER — Encounter (HOSPITAL_COMMUNITY): Payer: Self-pay | Admitting: Obstetrics & Gynecology

## 2023-07-05 DIAGNOSIS — O468X3 Other antepartum hemorrhage, third trimester: Secondary | ICD-10-CM

## 2023-07-05 DIAGNOSIS — Z3A14 14 weeks gestation of pregnancy: Secondary | ICD-10-CM | POA: Insufficient documentation

## 2023-07-05 DIAGNOSIS — O468X2 Other antepartum hemorrhage, second trimester: Secondary | ICD-10-CM | POA: Diagnosis not present

## 2023-07-05 DIAGNOSIS — O418X20 Other specified disorders of amniotic fluid and membranes, second trimester, not applicable or unspecified: Secondary | ICD-10-CM | POA: Diagnosis not present

## 2023-07-05 DIAGNOSIS — O418X30 Other specified disorders of amniotic fluid and membranes, third trimester, not applicable or unspecified: Secondary | ICD-10-CM

## 2023-07-05 DIAGNOSIS — N898 Other specified noninflammatory disorders of vagina: Secondary | ICD-10-CM | POA: Diagnosis present

## 2023-07-05 NOTE — MAU Provider Note (Signed)
Event Date/Time  First Provider Initiated Contact with Patient 07/05/23 1848     S Suzanne Lamb is a 32 y.o. [phone] pregnant female at [redacted]w[redacted]d who presents to MAU today with complaint of brown discharge twice today, but denies pain or any other symptoms. Has been seen at [facility] and is 14wks. Was also told she has a "blood clot in my uterus that should go away." No other physical complaints.  Receives care at [facility] & [facility]. Prenatal records reviewed.  Pertinent items noted in HPI and remainder of comprehensive ROS otherwise negative.   O BP 114/65   Pulse (!) 102   Temp 98 F (36.7 C)   Resp 15   Ht 5\' 10"  (1.778 m)   Wt 153 lb 6.4 oz (69.6 kg)   LMP 03/26/2023 (Approximate)   SpO2 99%   BMI 22.01 kg/m  Physical Exam Vitals and nursing note reviewed.  Constitutional:      Appearance: Normal appearance.  Cardiovascular:     Rate and Rhythm: Normal rate and regular rhythm.  Pulmonary:     Effort: Pulmonary effort is normal.  Abdominal:     Palpations: Abdomen is soft.     Tenderness: There is no abdominal tenderness.  Musculoskeletal:        General: Normal range of motion.  Skin:    General: Skin is warm and dry.     Capillary Refill: Capillary refill takes less than 2 seconds.  Neurological:     Mental Status: She is alert and oriented to person, place, and time.  Psychiatric:        Mood and Affect: Mood normal.        Behavior: Behavior normal.   FHR: 153  MDM: Straightforward MAU Course: Reviewed expected course of resolution with subchorionic hematoma and how brown discharge is expected. Advised pelvic rest for the next two weeks to allow it to resolve without much bleeding, pt expressed understanding. Has follow up scheduled at [facility].  A Subchorionic hematoma in third trimester, single or unspecified fetus - Plan: Discharge patient  [redacted] weeks gestation of pregnancy   P Discharge from MAU in stable condition with bleeding  precautions Follow up at [facility] as scheduled for ongoing prenatal care  Allergies as of 07/05/2023       Reactions   Shrimp (diagnostic)         Medication List     TAKE these medications    diphenhydrAMINE 25 MG tablet Commonly known as: BENADRYL Take 1 tablet (25 mg total) by mouth every 6 (six) hours as needed (Nausea and Vomiting in pregnancy).   Doxylamine-Pyridoxine 10-10 MG Tbec Take 1 tablet by mouth 2 (two) times daily as needed (Nausea).   Prenatal Complete 14-0.4 MG Tabs Take 1 tablet by mouth daily.       Bernerd Limbo, [place] 07/05/2023 8:04 PM

## 2023-07-05 NOTE — MAU Note (Signed)
.  Suzanne Lamb is a 31 y.o. at Unknown here in MAU reporting: a brownish discharge today since 4 pm denies pain. Was seen in her ob office on 08/28 ([facility]) and had an ultrasound that showed she was 14 weeks. Denies recent intercourse.   Onset of complaint: 4 pm Pain score: 0/10 There were no vitals filed for this visit.   FHT:153 Lab orders placed from triage:

## 2023-11-01 ENCOUNTER — Encounter (HOSPITAL_COMMUNITY): Payer: Self-pay | Admitting: Obstetrics and Gynecology

## 2023-11-01 ENCOUNTER — Inpatient Hospital Stay (HOSPITAL_COMMUNITY)
Admission: AD | Admit: 2023-11-01 | Discharge: 2023-11-01 | Disposition: A | Discharge status: Home / Self Care | Payer: 59 | Attending: Obstetrics & Gynecology | Admitting: Obstetrics & Gynecology

## 2023-11-01 DIAGNOSIS — Z3A31 31 weeks gestation of pregnancy: Secondary | ICD-10-CM | POA: Diagnosis not present

## 2023-11-01 DIAGNOSIS — A0811 Acute gastroenteropathy due to Norwalk agent: Secondary | ICD-10-CM | POA: Insufficient documentation

## 2023-11-01 DIAGNOSIS — O26893 Other specified pregnancy related conditions, third trimester: Secondary | ICD-10-CM | POA: Diagnosis not present

## 2023-11-01 LAB — CBC WITH DIFFERENTIAL/PLATELET
Abs Immature Granulocytes: 0.17 10*3/uL — ABNORMAL HIGH (ref 0.00–0.07)
Basophils Absolute: 0 10*3/uL (ref 0.0–0.1)
Basophils Relative: 0 %
Eosinophils Absolute: 0 10*3/uL (ref 0.0–0.5)
Eosinophils Relative: 0 %
HCT: 33.2 % — ABNORMAL LOW (ref 36.0–46.0)
Hemoglobin: 11.3 g/dL — ABNORMAL LOW (ref 12.0–15.0)
Immature Granulocytes: 1 %
Lymphocytes Relative: 3 %
Lymphs Abs: 0.4 10*3/uL — ABNORMAL LOW (ref 0.7–4.0)
MCH: 29.8 pg (ref 26.0–34.0)
MCHC: 34 g/dL (ref 30.0–36.0)
MCV: 87.6 fL (ref 80.0–100.0)
Monocytes Absolute: 0.6 10*3/uL (ref 0.1–1.0)
Monocytes Relative: 4 %
Neutro Abs: 13.3 10*3/uL — ABNORMAL HIGH (ref 1.7–7.7)
Neutrophils Relative %: 92 %
Platelets: 253 10*3/uL (ref 150–400)
RBC: 3.79 MIL/uL — ABNORMAL LOW (ref 3.87–5.11)
RDW: 13.8 % (ref 11.5–15.5)
WBC: 14.5 10*3/uL — ABNORMAL HIGH (ref 4.0–10.5)
nRBC: 0 % (ref 0.0–0.2)

## 2023-11-01 LAB — URINALYSIS, ROUTINE W REFLEX MICROSCOPIC
Bacteria, UA: NONE SEEN
Bilirubin Urine: NEGATIVE
Glucose, UA: NEGATIVE mg/dL
Hgb urine dipstick: NEGATIVE
Ketones, ur: 80 mg/dL — AB
Leukocytes,Ua: NEGATIVE
Nitrite: NEGATIVE
Protein, ur: 30 mg/dL — AB
Specific Gravity, Urine: 1.03 (ref 1.005–1.030)
pH: 5 (ref 5.0–8.0)

## 2023-11-01 LAB — COMPREHENSIVE METABOLIC PANEL
ALT: 35 U/L (ref 0–44)
AST: 42 U/L — ABNORMAL HIGH (ref 15–41)
Albumin: 3.2 g/dL — ABNORMAL LOW (ref 3.5–5.0)
Alkaline Phosphatase: 114 U/L (ref 38–126)
Anion gap: 12 (ref 5–15)
BUN: 11 mg/dL (ref 6–20)
CO2: 19 mmol/L — ABNORMAL LOW (ref 22–32)
Calcium: 8.5 mg/dL — ABNORMAL LOW (ref 8.9–10.3)
Chloride: 103 mmol/L (ref 98–111)
Creatinine, Ser: 0.66 mg/dL (ref 0.44–1.00)
GFR, Estimated: >60 mL/min (ref >60)
Glucose, Bld: 107 mg/dL — ABNORMAL HIGH (ref 70–99)
Potassium: 3.4 mmol/L — ABNORMAL LOW (ref 3.5–5.1)
Sodium: 134 mmol/L — ABNORMAL LOW (ref 135–145)
Total Bilirubin: 0.9 mg/dL (ref 0.0–1.2)
Total Protein: 7.1 g/dL (ref 6.5–8.1)

## 2023-11-01 LAB — LACTIC ACID, PLASMA: Lactic Acid, Venous: 1.6 mmol/L (ref 0.5–1.9)

## 2023-11-01 MED ORDER — LACTATED RINGERS IV BOLUS
1000.0000 mL | Freq: Once | INTRAVENOUS | Status: AC
  Administered 2023-11-01: 1000 mL via INTRAVENOUS

## 2023-11-01 MED ORDER — SODIUM CHLORIDE 0.9 % IV SOLN
12.5000 mg | Freq: Once | INTRAVENOUS | Status: AC
  Administered 2023-11-01: 12.5 mg via INTRAVENOUS
  Filled 2023-11-01: qty 12.5

## 2023-11-01 MED ORDER — ACETAMINOPHEN 500 MG PO TABS
1000.0000 mg | ORAL_TABLET | Freq: Once | ORAL | Status: AC
  Administered 2023-11-01: 1000 mg via ORAL
  Filled 2023-11-01: qty 2

## 2023-11-01 MED ORDER — ONDANSETRON HCL 4 MG PO TABS: 4.0000 mg | ORAL_TABLET | Freq: Three times a day (TID) | ORAL | 0 refills | Status: AC | PRN

## 2023-11-01 MED ORDER — ONDANSETRON HCL 4 MG/2ML IJ SOLN
4.0000 mg | Freq: Once | INTRAMUSCULAR | Status: AC
  Administered 2023-11-01: 4 mg via INTRAVENOUS
  Filled 2023-11-01: qty 2

## 2023-11-01 MED ORDER — PROMETHAZINE HCL 25 MG PO TABS: 25.0000 mg | ORAL_TABLET | Freq: Four times a day (QID) | ORAL | 0 refills | Status: AC | PRN

## 2023-11-01 MED ORDER — FAMOTIDINE IN NACL 20-0.9 MG/50ML-% IV SOLN
20.0000 mg | Freq: Once | INTRAVENOUS | Status: AC
  Administered 2023-11-01: 20 mg via INTRAVENOUS
  Filled 2023-11-01: qty 50

## 2023-11-01 NOTE — MAU Note (Addendum)
.  Suzanne Lamb is a 33 y.o. at [redacted]w[redacted]d here in MAU reporting: N/V, generalized body aches, HA and chills beginning yesterday. She has thrown up 10x in the last 24 hours and last PO was yesterday. She also had diarrhea yesterday that has since resolved. Denies being in contact with anyone who has been sick. Denies VB or abnormal discharge/LOF. Reports +FM.  Pt actively vomiting in triage.  LMP: N/A Onset of complaint: Yesterday Pain score: 5/10 Vitals:   11/01/23 0906 11/01/23 0911  BP: (!) 115/57 109/61  Pulse: (!) 129 (!) 113  Resp: (!) 24   Temp: 98 F (36.7 C)   SpO2: 100%      FHT:160 bpm Lab orders placed from triage: UA

## 2023-11-01 NOTE — MAU Provider Note (Signed)
 History     CSN: [id number]  Arrival date and time: 11/01/23 9157   Event Date/Time   First Provider Initiated Contact with Patient 11/01/23 [phone]      Chief Complaint  Patient presents with   Nausea   Generalized Body Aches   Chills   Emesis   HPI  Suzanne Lamb is a 33 y.o. female G53P2002 @ [redacted]w[redacted]d here in MAU with complaints of N/V/D. Symptoms started yesterday. No one in the home has symptoms. She does not think this is related to food born illness. She reports body aches and chills. Has vomited more than 10 times. + fetal movement. No bleeding. Has never had these symptoms in the past.   OB History     Gravida  3   Para  2   Term  2   Preterm      AB      Living  2      SAB      IAB      Ectopic      Multiple  0   Live Births  2           Past Medical History:  Diagnosis Date   Anemia    Bell's palsy 2014   Depression    Hx of chlamydia infection    Hx of gonorrhea     Past Surgical History:  Procedure Laterality Date   NO PAST SURGERIES      Family History  Problem Relation Age of Onset   Diabetes Mother    Hypertension Mother    Kidney disease Mother    Diabetes Sister    Diabetes Maternal Grandmother    Kidney disease Maternal Grandmother    Alcohol abuse Neg Hx    Arthritis Neg Hx    Asthma Neg Hx    Birth defects Neg Hx    Cancer Neg Hx    COPD Neg Hx    Depression Neg Hx    Drug abuse Neg Hx    Early death Neg Hx    Hearing loss Neg Hx    Heart disease Neg Hx    Hyperlipidemia Neg Hx    Learning disabilities Neg Hx    Mental illness Neg Hx    Mental retardation Neg Hx    Miscarriages / Stillbirths Neg Hx    Stroke Neg Hx    Vision loss Neg Hx    Colon cancer Neg Hx    Liver cancer Neg Hx    Stomach cancer Neg Hx    Esophageal cancer Neg Hx    Pancreatic cancer Neg Hx     Social History   Tobacco Use   Smoking status: Never   Smokeless tobacco: Never  Vaping Use   Vaping status: Never Used  Substance  Use Topics   Alcohol use: No   Drug use: Not Currently    Types: Marijuana    Comment: stopped with pos UPT-did not start back    Allergies:  Allergies  Allergen Reactions   Shrimp (Diagnostic)     Medications Prior to Admission  Medication Sig Dispense Refill Last Dose/Taking   Prenatal Vit-Fe Fumarate-FA (PRENATAL COMPLETE) 14-0.4 MG TABS Take 1 tablet by mouth daily. 270 tablet 0 11/01/2023 Morning   diphenhydrAMINE  (BENADRYL ) 25 MG tablet Take 1 tablet (25 mg total) by mouth every 6 (six) hours as needed (Nausea and Vomiting in pregnancy). 30 tablet 0    Doxylamine -Pyridoxine  10-10 MG TBEC Take 1 tablet by mouth  2 (two) times daily as needed (Nausea). 60 tablet 0    Results for orders placed or performed during the hospital encounter of 11/01/23 (from the past 48 hours)  Urinalysis, Routine w reflex microscopic -Urine, Clean Catch     Status: Abnormal   Collection Time: 11/01/23  8:42 AM  Result Value Ref Range   Color, Urine YELLOW YELLOW   APPearance HAZY (A) CLEAR   Specific Gravity, Urine 1.030 1.005 - 1.030   pH 5.0 5.0 - 8.0   Glucose, UA NEGATIVE NEGATIVE mg/dL   Hgb urine dipstick NEGATIVE NEGATIVE   Bilirubin Urine NEGATIVE NEGATIVE   Ketones, ur 80 (A) NEGATIVE mg/dL   Protein, ur 30 (A) NEGATIVE mg/dL   Nitrite NEGATIVE NEGATIVE   Leukocytes,Ua NEGATIVE NEGATIVE   RBC / HPF 0-5 0 - 5 RBC/hpf   WBC, UA 0-5 0 - 5 WBC/hpf   Bacteria, UA NONE SEEN NONE SEEN   Squamous Epithelial / HPF 0-5 0 - 5 /HPF   Mucus PRESENT     Comment: Performed at [facility], [address], [place], [place] [address]  Lactic acid, plasma     Status: None   Collection Time: 11/01/23  9:32 AM  Result Value Ref Range   Lactic Acid, Venous 1.6 0.5 - 1.9 mmol/L    Comment: Performed at [facility], [address] [place], [place] [address]  CBC with Differential/Platelet     Status: Abnormal   Collection Time: 11/01/23  9:33 AM  Result Value Ref Range   WBC 14.5 (H) 4.0 -  10.5 K/uL   RBC 3.79 (L) 3.87 - 5.11 MIL/uL   Hemoglobin 11.3 (L) 12.0 - 15.0 g/dL   HCT 66.7 (L) 63.9 - 53.9 %   MCV 87.6 80.0 - 100.0 fL   MCH 29.8 26.0 - 34.0 pg   MCHC 34.0 30.0 - 36.0 g/dL   RDW 86.1 88.4 - 84.4 %   Platelets 253 150 - 400 K/uL   nRBC 0.0 0.0 - 0.2 %   Neutrophils Relative % 92 %   Neutro Abs 13.3 (H) 1.7 - 7.7 K/uL   Lymphocytes Relative 3 %   Lymphs Abs 0.4 (L) 0.7 - 4.0 K/uL   Monocytes Relative 4 %   Monocytes Absolute 0.6 0.1 - 1.0 K/uL   Eosinophils Relative 0 %   Eosinophils Absolute 0.0 0.0 - 0.5 K/uL   Basophils Relative 0 %   Basophils Absolute 0.0 0.0 - 0.1 K/uL   Immature Granulocytes 1 %   Abs Immature Granulocytes 0.17 (H) 0.00 - 0.07 K/uL    Comment: Performed at [facility], [address], [place], [place] [address]  Comprehensive metabolic panel     Status: Abnormal   Collection Time: 11/01/23  9:33 AM  Result Value Ref Range   Sodium 134 (L) 135 - 145 mmol/L   Potassium 3.4 (L) 3.5 - 5.1 mmol/L   Chloride 103 98 - 111 mmol/L   CO2 19 (L) 22 - 32 mmol/L   Glucose, Bld 107 (H) 70 - 99 mg/dL    Comment: Glucose reference range applies only to samples taken after fasting for at least 8 hours.   BUN 11 6 - 20 mg/dL   Creatinine, Ser 9.33 0.44 - 1.00 mg/dL   Calcium 8.5 (L) 8.9 - 10.3 mg/dL   Total Protein 7.1 6.5 - 8.1 g/dL   Albumin 3.2 (L) 3.5 - 5.0 g/dL   AST 42 (H) 15 -  41 U/L   ALT 35 0 - 44 U/L   Alkaline Phosphatase 114 38 - 126 U/L   Total Bilirubin 0.9 0.0 - 1.2 mg/dL   GFR, Estimated >39 >39 mL/min    Comment: (NOTE) Calculated using the CKD-EPI Creatinine Equation (2021)    Anion gap 12 5 - 15    Comment: Performed at [facility], [address], [place], [place] [address]     Review of Systems  Constitutional:  Positive for chills. Negative for fever.  Gastrointestinal:  Positive for diarrhea, nausea and vomiting.   Physical Exam   Blood pressure 111/64, pulse (!) 108, temperature 98 F (36.7 C),  temperature source Oral, resp. rate 18, height 5' 10 (1.778 m), weight 82.2 kg, last menstrual period 03/26/2023, SpO2 100%.  Physical Exam Constitutional:      Appearance: Normal appearance. She is ill-appearing. She is not diaphoretic.  HENT:     Head: Normocephalic.  Eyes:     Pupils: Pupils are equal, round, and reactive to light.  Skin:    General: Skin is warm.  Neurological:     Mental Status: She is alert and oriented to person, place, and time.    Fetal Tracing: Baseline: Variability: Accelerations:  Decelerations: Toco:   MAU Course  Procedures  MDM  UA shows > 80 ketones  LR bolus x 2 Zofran  4 mg IV with Pepcid  20 mg IV  Phenergan  12.5 mg IV  Tylenol  1000 mg given PO  Liver enzymes mildly elevated, likely 2/2 vomiting. Patient to notify OB at [facility] to have them rechecked in the office.   Assessment and Plan   A:  1. Gastroenteritis due to norovirus   2. [redacted] weeks gestation of pregnancy      P:  Dc home Rx: Phenergan  and Zofran   Small, frequent meals Increase oral fluids  Return to MAU if symptoms worsen   Dorita Nest I, NP 11/01/2023 8:40 PM

## 2023-11-02 LAB — CULTURE, OB URINE: Culture: NO GROWTH
